# Patient Record
Sex: Female | Born: 1955 | Race: White | Hispanic: No | Marital: Married | Smoking: Former smoker
Health system: Southern US, Community
[De-identification: ages and names within clinical notes are randomized; demographics above are authoritative.]

## PROBLEM LIST (undated history)

## (undated) DIAGNOSIS — R011 Cardiac murmur, unspecified: Secondary | ICD-10-CM

## (undated) DIAGNOSIS — A770 Spotted fever due to Rickettsia rickettsii: Secondary | ICD-10-CM

## (undated) DIAGNOSIS — C55 Malignant neoplasm of uterus, part unspecified: Secondary | ICD-10-CM

## (undated) HISTORY — DX: Malignant neoplasm of uterus, part unspecified: C55

## (undated) HISTORY — PX: TONSILLECTOMY: SUR1361

## (undated) HISTORY — DX: Spotted fever due to Rickettsia rickettsii: A77.0

## (undated) HISTORY — DX: Cardiac murmur, unspecified: R01.1

---

## 1998-12-22 ENCOUNTER — Other Ambulatory Visit: Admission: RE | Admit: 1998-12-22 | Discharge: 1998-12-22 | Payer: Self-pay | Admitting: Obstetrics and Gynecology

## 1999-10-02 ENCOUNTER — Encounter: Payer: Self-pay | Admitting: Obstetrics and Gynecology

## 1999-10-02 ENCOUNTER — Encounter: Admission: RE | Admit: 1999-10-02 | Discharge: 1999-10-02 | Payer: Self-pay | Admitting: Obstetrics and Gynecology

## 1999-12-25 ENCOUNTER — Other Ambulatory Visit: Admission: RE | Admit: 1999-12-25 | Discharge: 1999-12-25 | Payer: Self-pay | Admitting: Obstetrics and Gynecology

## 2000-10-04 ENCOUNTER — Encounter: Admission: RE | Admit: 2000-10-04 | Discharge: 2000-10-04 | Payer: Self-pay | Admitting: Obstetrics and Gynecology

## 2000-10-04 ENCOUNTER — Encounter: Payer: Self-pay | Admitting: Obstetrics and Gynecology

## 2001-02-18 ENCOUNTER — Other Ambulatory Visit: Admission: RE | Admit: 2001-02-18 | Discharge: 2001-02-18 | Payer: Self-pay | Admitting: Obstetrics and Gynecology

## 2002-01-15 ENCOUNTER — Encounter: Admission: RE | Admit: 2002-01-15 | Discharge: 2002-01-15 | Payer: Self-pay | Admitting: Obstetrics and Gynecology

## 2002-01-15 ENCOUNTER — Encounter: Payer: Self-pay | Admitting: Obstetrics and Gynecology

## 2002-10-19 ENCOUNTER — Other Ambulatory Visit: Admission: RE | Admit: 2002-10-19 | Discharge: 2002-10-19 | Payer: Self-pay | Admitting: Obstetrics and Gynecology

## 2003-10-26 ENCOUNTER — Encounter: Admission: RE | Admit: 2003-10-26 | Discharge: 2003-10-26 | Payer: Self-pay | Admitting: Obstetrics and Gynecology

## 2003-11-03 ENCOUNTER — Other Ambulatory Visit: Admission: RE | Admit: 2003-11-03 | Discharge: 2003-11-03 | Payer: Self-pay | Admitting: Obstetrics and Gynecology

## 2005-08-29 ENCOUNTER — Other Ambulatory Visit: Admission: RE | Admit: 2005-08-29 | Discharge: 2005-08-29 | Payer: Self-pay | Admitting: Obstetrics and Gynecology

## 2005-09-07 ENCOUNTER — Encounter: Admission: RE | Admit: 2005-09-07 | Discharge: 2005-09-07 | Payer: Self-pay | Admitting: Obstetrics and Gynecology

## 2005-09-19 ENCOUNTER — Encounter: Admission: RE | Admit: 2005-09-19 | Discharge: 2005-09-19 | Payer: Self-pay | Admitting: Obstetrics and Gynecology

## 2005-12-03 HISTORY — PX: VAGINAL HYSTERECTOMY: SUR661

## 2005-12-05 ENCOUNTER — Inpatient Hospital Stay (HOSPITAL_COMMUNITY): Admission: AD | Admit: 2005-12-05 | Discharge: 2005-12-05 | Payer: Self-pay | Admitting: Obstetrics and Gynecology

## 2006-02-18 ENCOUNTER — Encounter (INDEPENDENT_AMBULATORY_CARE_PROVIDER_SITE_OTHER): Payer: Self-pay | Admitting: *Deleted

## 2006-02-19 ENCOUNTER — Inpatient Hospital Stay (HOSPITAL_COMMUNITY): Admission: RE | Admit: 2006-02-19 | Discharge: 2006-02-20 | Payer: Self-pay | Admitting: Obstetrics and Gynecology

## 2006-09-25 ENCOUNTER — Encounter: Admission: RE | Admit: 2006-09-25 | Discharge: 2006-09-25 | Payer: Self-pay | Admitting: Obstetrics and Gynecology

## 2007-10-23 ENCOUNTER — Encounter: Admission: RE | Admit: 2007-10-23 | Discharge: 2007-10-23 | Payer: Self-pay | Admitting: Obstetrics and Gynecology

## 2007-10-28 ENCOUNTER — Encounter: Admission: RE | Admit: 2007-10-28 | Discharge: 2007-10-28 | Payer: Self-pay | Admitting: Obstetrics and Gynecology

## 2007-12-10 ENCOUNTER — Other Ambulatory Visit: Admission: RE | Admit: 2007-12-10 | Discharge: 2007-12-10 | Payer: Self-pay | Admitting: Obstetrics and Gynecology

## 2008-02-22 ENCOUNTER — Emergency Department (HOSPITAL_COMMUNITY): Admission: EM | Admit: 2008-02-22 | Discharge: 2008-02-22 | Payer: Self-pay | Admitting: Family Medicine

## 2008-10-25 ENCOUNTER — Encounter: Admission: RE | Admit: 2008-10-25 | Discharge: 2008-10-25 | Payer: Self-pay | Admitting: Obstetrics and Gynecology

## 2009-01-04 ENCOUNTER — Emergency Department (HOSPITAL_COMMUNITY): Admission: EM | Admit: 2009-01-04 | Discharge: 2009-01-04 | Payer: Self-pay | Admitting: Emergency Medicine

## 2009-02-04 ENCOUNTER — Ambulatory Visit: Payer: Self-pay | Admitting: Obstetrics and Gynecology

## 2009-02-04 ENCOUNTER — Other Ambulatory Visit: Admission: RE | Admit: 2009-02-04 | Discharge: 2009-02-04 | Payer: Self-pay | Admitting: Obstetrics and Gynecology

## 2009-02-04 ENCOUNTER — Encounter: Payer: Self-pay | Admitting: Obstetrics and Gynecology

## 2009-04-01 ENCOUNTER — Emergency Department (HOSPITAL_COMMUNITY): Admission: EM | Admit: 2009-04-01 | Discharge: 2009-04-01 | Payer: Self-pay | Admitting: Family Medicine

## 2009-09-23 ENCOUNTER — Ambulatory Visit: Payer: Self-pay | Admitting: Family Medicine

## 2009-10-20 ENCOUNTER — Ambulatory Visit: Payer: Self-pay | Admitting: Family Medicine

## 2009-10-22 DIAGNOSIS — K921 Melena: Secondary | ICD-10-CM | POA: Insufficient documentation

## 2009-10-24 ENCOUNTER — Encounter (INDEPENDENT_AMBULATORY_CARE_PROVIDER_SITE_OTHER): Payer: Self-pay | Admitting: *Deleted

## 2009-11-22 ENCOUNTER — Ambulatory Visit: Payer: Self-pay | Admitting: Gastroenterology

## 2009-11-22 ENCOUNTER — Encounter (INDEPENDENT_AMBULATORY_CARE_PROVIDER_SITE_OTHER): Payer: Self-pay | Admitting: *Deleted

## 2009-12-01 ENCOUNTER — Ambulatory Visit: Payer: Self-pay | Admitting: Gastroenterology

## 2009-12-06 ENCOUNTER — Encounter: Payer: Self-pay | Admitting: Gastroenterology

## 2009-12-16 ENCOUNTER — Telehealth: Payer: Self-pay | Admitting: Family Medicine

## 2009-12-20 ENCOUNTER — Ambulatory Visit: Payer: Self-pay | Admitting: Family Medicine

## 2010-04-08 ENCOUNTER — Emergency Department (HOSPITAL_COMMUNITY): Admission: EM | Admit: 2010-04-08 | Discharge: 2010-04-08 | Payer: Self-pay | Admitting: Emergency Medicine

## 2010-05-29 ENCOUNTER — Telehealth: Payer: Self-pay | Admitting: Family Medicine

## 2010-06-09 ENCOUNTER — Ambulatory Visit: Payer: Self-pay | Admitting: Family Medicine

## 2010-06-09 DIAGNOSIS — K120 Recurrent oral aphthae: Secondary | ICD-10-CM | POA: Insufficient documentation

## 2010-06-12 ENCOUNTER — Telehealth: Payer: Self-pay | Admitting: Family Medicine

## 2010-08-03 ENCOUNTER — Encounter: Admission: RE | Admit: 2010-08-03 | Discharge: 2010-08-03 | Payer: Self-pay | Admitting: Internal Medicine

## 2010-08-23 ENCOUNTER — Encounter: Admission: RE | Admit: 2010-08-23 | Discharge: 2010-08-23 | Payer: Self-pay | Admitting: Obstetrics and Gynecology

## 2010-08-23 ENCOUNTER — Ambulatory Visit: Payer: Self-pay | Admitting: Obstetrics and Gynecology

## 2010-08-23 ENCOUNTER — Other Ambulatory Visit: Admission: RE | Admit: 2010-08-23 | Discharge: 2010-08-23 | Payer: Self-pay | Admitting: Obstetrics and Gynecology

## 2010-08-30 ENCOUNTER — Encounter: Admission: RE | Admit: 2010-08-30 | Discharge: 2010-08-30 | Payer: Self-pay | Admitting: Obstetrics and Gynecology

## 2010-12-23 ENCOUNTER — Encounter: Payer: Self-pay | Admitting: General Surgery

## 2010-12-24 ENCOUNTER — Encounter: Payer: Self-pay | Admitting: Obstetrics and Gynecology

## 2011-01-02 NOTE — Assessment & Plan Note (Signed)
Summary: FLU SHOT,TETENUS/TOWER/CLE  Nurse Visit   Allergies: 1)  ! Codeine  Immunizations Administered:  Influenza Vaccine # 1:    Vaccine Type: Fluvax 3+    Site: right deltoid    Mfr: GlaxoSmithKline    Dose: 0.5 ml    Route: IM    Given by: Lowella Petties CMA    Exp. Date: 06/01/2010    Lot #: [id number]    VIS given: 06/26/07 version given December 20, 2009.  Tetanus Vaccine:    Vaccine Type: Td    Site: left deltoid    Mfr: Sanofi Pasteur    Dose: 0.5 ml    Route: IM    Given by: Lowella Petties CMA    Exp. Date: 12/08/2011    Lot #: [id number]    VIS given: 10/21/07 version given December 20, 2009.  Flu Vaccine Consent Questions:    Do you have a history of severe allergic reactions to this vaccine? no    Any prior history of allergic reactions to egg and/or gelatin? no    Do you have a sensitivity to the preservative Thimersol? no    Do you have a past history of Guillan-Barre Syndrome? no    Do you currently have an acute febrile illness? no    Have you ever had a severe reaction to latex? no    Vaccine information given and explained to patient? yes    Are you currently pregnant? no  Orders Added: 1)  Flu Vaccine 44yrs + [90658] 2)  Admin 1st Vaccine [90471] 3)  TD Toxoids IM 7 YR + [90714] 4)  Admin of Any Addtl Vaccine [62952]

## 2011-01-02 NOTE — Letter (Signed)
Summary: Patient Notice-Hyperplastic Polyps  [facility]  [address] [place], [place] [address]   Phone: [phone]  Fax: [phone]        December 06, 2009 MRN: [id number]    [facility] [address] [place], [place]  [address]    Dear Ms. 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  These types of polyps are NOT pre-cancerous.  It is therefore my recommendation that you have a repeat colonoscopy examination in 5_ years for routine colorectal cancer screening.  Should you develop new or worsening symptoms of abdominal pain, bowel habit changes or bleeding from the rectum or bowels, please schedule an evaluation with either your primary care physician or with me.  Additional information/recommendations:  __No further action with gastroenterology is needed at this time.      Please follow-up with your primary care physician for your other      healthcare needs. __Please call [phone] to schedule a return visit to review      your situation.  __Please keep your follow-up visit as already scheduled.  _x_Continue treatment plan as outlined the day of your exam.  Please call us if you are having persistent problems or have questions about your condition that have not been fully answered at this time.  Sincerely,  Louis Meckel MD This letter has been electronically signed by your physician.  Appended Document: Patient Notice-Hyperplastic Polyps Letter mailed 1.14.11

## 2011-01-02 NOTE — Progress Notes (Signed)
Summary: Head congested, sorethroat  Phone Note Call from Patient Call back at [phone] or [phone] work cell   Caller: Patient Call For: Judith Part MD Summary of Call: On 12/13/09 started with head congestion, now has sorethroat and dry cough, ears bother her slightly. Pt doesn't think fever has been taking Dayquil. Pt wonders is there anything else Dr. Milinda Antis could suggest. Pt uses [facility] [phone]. Please advise.  Initial call taken by: Lewanda Rife LPN,  December 16, 2009 1:50 PM  Follow-up for Phone Call        I like mucinex Dm for congestion and cough  f/u if not improved  drink lots of fluids  Follow-up by: Judith Part MD,  December 16, 2009 3:16 PM  Additional Follow-up for Phone Call Additional follow up Details #1::        Advised pt. Additional Follow-up by: Lowella Petties CMA,  December 16, 2009 3:41 PM

## 2011-01-02 NOTE — Progress Notes (Signed)
Summary: sore throat x 1 week   Phone Note Call from Patient Call back at Work Phone [phone]   Caller: Patient Call For: Judith Part MD Summary of Call: Patient calling complaining of sore throat x 1 week. She says that she has no other symptoms. It only hurts when swollowing. She has been taking Ibuprofen for this and has alos tried taking OTC allergy meds (equate brand from [facility]). She wants to know if you have any other suggestions on what she should do for her throat.  Initial call taken by: Melody Comas,  May 29, 2010 11:47 AM  Follow-up for Phone Call        tylenol and chloraseptic throat spray otc are helpful if not improving ( esp if fever or other symptoms)- please f/u Follow-up by: Judith Part MD,  May 29, 2010 1:11 PM  Additional Follow-up for Phone Call Additional follow up Details #1::        Patient notified, will call back if no improvement.  Additional Follow-up by: Melody Comas,  May 29, 2010 1:14 PM

## 2011-01-02 NOTE — Assessment & Plan Note (Signed)
Summary: SORES IN HER MOUTH   Vital Signs:  Patient profile:   55 year old female Height:      65.5 inches Temp:     98.1 degrees F oral Pulse rate:   68 / minute Pulse rhythm:   regular BP sitting:   110 / 70  (right arm) Cuff size:   regular  Vitals Entered By: Linde Gillis CMA Duncan Dull) (June 09, 2010 12:27 PM) CC: sore in mouth and on tongue Comments patient refused to weigh   History of Present Illness: 55 yo here for 2-3 weeks of sores in her mouth.  Mouth sores- first noticed one in inside of her lip, now feels several throughout her mouth.  Has also been painful to swallow, on and off. Never had anything like this before. No fevers, chills, runny nose, other URI symptoms.  Current Medications (verified): 1)  Advil 200 Mg Tabs (Ibuprofen) .... Otc As Directed. 2)  Valtrex 1 Gm Tabs (Valacyclovir Hcl) .... 2 Tab By Mouth Two Times A Day X 1 Days  Allergies: 1)  ! 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    cardiol - Dr Tresa Endo  gyn Dianne Dun   Past Surgical History: Last updated: 09/23/2009 hysterect partial -- fibroids   Family History: Last updated: 09/23/2009 mother CAD , with bypass and stents , started in 21s  father - does not have contact with  Maunts and uncles - CAD  Mcousin DM Muncle DM  great aunt DM   MGF with heart dz in his 43s  mGM pacemaker , CAD with stents , deg joint dz   Social History: Last updated: 09/23/2009 regularly exercises  former smoker - quit over 5 years ago  occ alcohol  is married  2 sons- age 87 , and 75 -- still at home  works for [employer] - loves her job   Review of Systems      See HPI General:  Denies chills and fever. ENT:  Denies difficulty swallowing. GI:  Denies abdominal pain, nausea, and vomiting.  Physical Exam  General:  Well-developed,well-nourished,in no acute distress; alert,appropriate and cooperative throughout  examination Mouth:  aphthous ulcer(s).   ?possible herpetic labilal lesion Cervical Nodes:  No lymphadenopathy noted Psych:  Cognition and judgment appear intact. Alert and cooperative with normal attention span and concentration. No apparent delusions, illusions, hallucinations   Impression & Recommendations:  Problem # 1:  APHTHOUS ULCERS (ICD-528.2) Assessment New Reassurance provided. Given that one lesion appeared herpetic, will treat with Valtrex 2 g two times a day x 1 day. Follow up as needed.  Complete Medication List: 1)  Advil 200 Mg Tabs (Ibuprofen) .... Otc as directed. 2)  Valtrex 1 Gm Tabs (Valacyclovir hcl) .... 2 tab by mouth two times a day x 1 days Prescriptions: VALTREX 1 GM TABS (VALACYCLOVIR HCL) 2 tab by mouth two times a day x 1 days  #4 x 0   Entered and Authorized by:   Ruthe Mannan MD   Signed by:   Ruthe Mannan MD on 06/09/2010   Method used:   [facility]ronically [facility]        [facility] (retail)       [address]       [place], [place]  [address]       Ph: [phone] or [phone]       Fax: [phone]   RxID:   [id number]  Current Allergies (reviewed today): ! CODEINE

## 2011-01-02 NOTE — Progress Notes (Signed)
Summary: not any better  Phone Note Call from Patient Call back at Work Phone [phone]   Caller: Patient Summary of Call: Pt was seen on friday for sores in her mouth.  She says she is not any better, still having to take ibuprofen for the pain.  She also has poison ivy and is asking if something can be called in for that.  She doesnt have any on her face and would like to avoid that.  [facility] and is asking if she can have a mouth wash for the mouth sores and sore throat.  She is going out of town on business tomorrow for 3 days.  Please let her know. Initial call taken by: Lowella Petties CMA,  June 12, 2010 4:48 PM  Follow-up for Phone Call        can try some dukes magic mouthwash elicon cream for poison ivy -- but f/u if not imp watch for signs of infection like inc redness/fever/pus Follow-up by: Judith Part MD,  June 12, 2010 5:06 PM  Additional Follow-up for Phone Call Additional follow up Details #1::        Patient notified as instructed by telephone. [facility] as instructed. Lewanda Rife LPN  June 12, 2010 5:13 PM     New/Updated Medications: * DUKE'S MAGIC MOUTHWASH swish 1 tsp by mouth three times a day and spit ELOCON 0.1 % CREA (MOMETASONE FUROATE) apply to affected areas once daily as needed Prescriptions: ELOCON 0.1 % CREA (MOMETASONE FUROATE) apply to affected areas once daily as needed  #1 medium x 0   Entered and Authorized by:   Judith Part MD   Signed by:   Lewanda Rife LPN on 09/81/1914   Method used:   Telephoned to ...       [facility] (retail)       [address]       [place], [place]  [address]       Ph: [phone] or [phone]       Fax: [phone]   RxID:   [id number] DUKE'S MAGIC MOUTHWASH swish 1 tsp by mouth three times a day and spit  #120cc x 0   Entered and Authorized by:   Judith Part MD   Signed by:   Lewanda Rife LPN on 63/87/5643   Method used:   Telephoned  to ...       [facility] (retail)       [address]       [place], [place]  [address]       Ph: [phone] or [phone]       Fax: [phone]   RxID:   [id number]

## 2011-01-08 ENCOUNTER — Telehealth: Payer: Self-pay | Admitting: Family Medicine

## 2011-01-18 NOTE — Progress Notes (Signed)
Summary: cal a nurse   Phone Note Call from Patient   Call For: Judith Part MD Summary of Call: Triage Record Num: [id number] Operator: Valene Bors Patient Name: Brooke Morrow Threats Call Date & Time: 01/06/2011 11:50:17AM Patient Phone: [phone] PCP: Audrie Gallus. Tower Patient Gender: Female PCP Fax : Patient DOB: 1955/12/06 Practice Name: [facility] Reason for Call: Nyimah is calling b/c she started with ear pain last night -01/05/11 and she has a slight sore throat. Afebrile. She is taking Alleve and it only helped a little. Care advice per Ear Sx Protocol and advised that she be seen at [facility] within 24 hours. Protocol(s) Used: Ear: Symptoms Recommended Outcome per Protocol: See Provider within 24 hours Reason for Outcome: Constant or intermittent dull earache, throbbing in ear(s) or feeling of fullness; may interfere with sleep or ability to carry out normal activities Care Advice: A warm washcloth or heating pad set on low to the affected ear may help relieve the discomfort. May apply for 15 to 20 minutes, 3 to 4 times a day.  ~  ~ Do not use eardrops unless directed by a healthcare provider, especially if having ear drainage. Resting or sleeping with head elevated, such as semi-reclining in a recliner, may help reduce inner ear pressure and discomfort.  ~ Keep ear canal as dry as possible. Take a bath instead of showering. Try to keep water out of ear when washing hair by placing cotton balls in ear opening. Avoid swimming or water sports until [place] by provider.  ~  ~ SYMPTOM / CONDITION MANAGEMENT  ~ CAUTIONS Analgesic/Antipyretic Advice - Acetaminophen: Consider acetaminophen as directed on label or by pharmacist/provider for pain or fever PRECAUTIONS: - Use if there is no history of liver disease, alcoholism, or intake of three or more alcohol drinks per day - Only if approved by provider during pregnancy or when breastfeeding - During pregnancy,  acetaminophen should not be taken more than 3 consecutive days without telling provider - Do not exceed recommended dose or frequency  ~ Analgesic/Antipyretic Advice - NSAID Initial call taken by: Melody Comas,  January 08, 2011 8:12 AM

## 2011-02-01 ENCOUNTER — Ambulatory Visit: Payer: Self-pay | Admitting: Obstetrics and Gynecology

## 2011-02-28 ENCOUNTER — Encounter: Payer: Self-pay | Admitting: Family Medicine

## 2011-02-28 LAB — HM MAMMOGRAPHY

## 2011-02-28 LAB — HM COLONOSCOPY

## 2011-03-01 ENCOUNTER — Other Ambulatory Visit: Payer: Self-pay | Admitting: Gastroenterology

## 2011-03-02 ENCOUNTER — Ambulatory Visit (INDEPENDENT_AMBULATORY_CARE_PROVIDER_SITE_OTHER): Payer: BC Managed Care – PPO | Admitting: Family Medicine

## 2011-03-02 ENCOUNTER — Encounter: Payer: Self-pay | Admitting: Family Medicine

## 2011-03-02 DIAGNOSIS — W57XXXA Bitten or stung by nonvenomous insect and other nonvenomous arthropods, initial encounter: Secondary | ICD-10-CM | POA: Insufficient documentation

## 2011-03-02 DIAGNOSIS — T148 Other injury of unspecified body region: Secondary | ICD-10-CM

## 2011-03-02 MED ORDER — DOXYCYCLINE HYCLATE 100 MG PO CAPS: 100.0000 mg | ORAL_CAPSULE | Freq: Two times a day (BID) | ORAL | Status: AC

## 2011-03-02 NOTE — Patient Instructions (Signed)
Wood Tick Bites Ticks are insects that attach themselves to the skin. Sometimes, ticks carry diseases that can make a person very ill. The most common places for ticks to attach themselves are the scalp, neck, armpits, waist, and groin. Ticks must be removed as soon as possible to help prevent diseases.  REMOVING A TICK  Put on latex gloves before trying to remove a tick.   Use tweezers to grasp the tick as close to the skin as possible.   Pull gently until the tick lets go. Pull the tick off in one motion. Do not twist the tick or jerk it suddenly. This may break off the tick's head or mouth parts.   Do not crush the tick's body. This could transfer diseased fluids from the tick into your body.   After the tick is removed, wash the bite area and your hands with soap and water.   Apply a small amount of antiseptic cream or ointment to the bite site.   Wash any tools that were used.   Save the tick in a jar or plastic bag to bring to the doctor if necessary.   Do not apply a hot match, petroleum jelly, or fingernail polish to the tick. This may increase the chances of disease from the tick bite.  YOU MIGHT NEED A TETANUS SHOT IF:  You cannot remember when your last tetanus shot was.   You have never had a tetanus shot.   Your bite site was dirty.  GET HELP IF:  You cannot remove a tick or part of the tick is left in the skin.   You have a temperature by mouth above 100.4.   You or your child has redness and puffiness (swelling) in the area of the tick bite.   You or your child has tender, puffy lymph glands.   You or your child has watery poop (diarrhea).   You or your child loses weight.   You or your child has a cough.   You or your child is more tired than normal.   You or your child has muscle, joint, or bone pain.   You or your child has belly (abdominal) pain.   You or your child has a headache.   You or your child has a rash.  GET HELP RIGHT AWAY IF:  You  or your child has a temperature by mouth above 100.4, not controlled by medicine.   You or your child has trouble walking or moving his or her legs.   You or your child loses feeling (numbness) in his or her legs.   You or your child has shortness of breath.   You or your child becomes confused.   You or your child throw ups (vomit) many times.  MAKE SURE YOU:   Understand these instructions.   Will watch your condition.

## 2011-03-02 NOTE — Progress Notes (Signed)
  Subjective:    Patient ID: Brooke Morrow, female    DOB: [birth year], 55 y.o.   MRN: [id number]  HPI 55 yo here for tick bite. Found the tick on her skin, Tuesday, 3/27 on left chest wall under bra. Tick was engorged, she is not sure how long it was there. Not sure what type of tick it was.  She denies any fevers, chills, nausea, or myalgias.    Review of Systems See HPI:  Gen:  Denies fevers, chills, myalgias HEENT: no photophobia Skin:  No rash  Objective:   Physical Exam BP 110/80  Pulse 80  Temp(Src) 98.1 F (36.7 C) (Oral)  Ht 5\' 5"  (1.651 m)  Wt 150 lb 12.8 oz (68.402 kg)  BMI 25.09 kg/m2 Gen: alert, NAD Skin: 2 cm circular, raised erythematous area on left side, no central clearing. No other lesions or rashes.       Assessment & Plan:

## 2011-03-02 NOTE — Assessment & Plan Note (Signed)
New.   Area appears very reassuring. Since we do not know how long the tick was attached or what type it was, will treat with Doxycycline 100 mg twice daily x 10 days. See pt instructions.

## 2011-03-02 NOTE — Progress Notes (Deleted)
55 yo here for tick bite.  ?tick attached for ?36 hours  When did this occur? Less than 72 hours after tick removal.  Review of Systems       See HPI  General:  Denies chills, myalgia and fever. ENT:  Denies difficulty swallowing. GI:  Denies abdominal pain, nausea, and vomiting. Skin:  + rash  Physical Exam  General:  Well-developed,well-nourished,in no acute distress; alert,appropriate and cooperative throughout examination Mouth:  aphthous ulcer(s).   ?possible herpetic labilal lesion Cervical Nodes:  No lymphadenopathy noted Psych:  Cognition and judgment appear intact. Alert and cooperative with normal attention span and concentration. No apparent delusions, illusions, hallucinations

## 2011-03-08 ENCOUNTER — Ambulatory Visit
Admission: RE | Admit: 2011-03-08 | Discharge: 2011-03-08 | Disposition: A | Discharge status: Home / Self Care | Payer: BC Managed Care – PPO | Source: Ambulatory Visit | Attending: Gastroenterology | Admitting: Gastroenterology

## 2011-04-18 ENCOUNTER — Other Ambulatory Visit: Payer: Self-pay | Admitting: Gastroenterology

## 2011-04-20 NOTE — Discharge Summary (Signed)
NAMEJACKELYNE, SAYER          ACCOUNT NO.:  [id number]   MEDICAL RECORD NO.:  [id number]          PATIENT TYPE:  INP   LOCATION:  1603                         FACILITY:  [facility]   PHYSICIAN:  Daniel L. Gottsegen, M.D.DATE OF BIRTH:  09-18-1956   DATE OF ADMISSION:  02/18/2006  DATE OF DISCHARGE:  02/20/2006                                 DISCHARGE SUMMARY   The patient is a 56 year old female, who was admitted to the hospital with  symptomatic uterine prolapse, rectocele, menorrhagia, dysmenorrhea, and  fibroids for surgery.  On the day of admission, she was taken to the  operating room, and a vaginal hysterectomy and posterior repair was  performed.  Postoperatively, she had some trouble with nausea, and the IVs  needed to be continued, and she also was somewhat sedated and not ambulating  on her own, so she was kept overnight.  By the 21st, however, she was  voiding well, drinking well, was not nauseated, and was out of bed  ambulating on her on.  She was discharged on Darvocet-N 100 for pain relief.  She will be seen in the office in 3 weeks.  Final pathology report is not  available at the time of dictation.   CONDITION ON DISCHARGE:  Improved.   DISCHARGE DIAGNOSES:  1.  Uterine prolapse.  2.  Fibroids.  3.  Rectocele.  4.  Dysmenorrhea and menorrhagia.   OPERATION:  Vaginal hysterectomy, posterior repair.      Daniel L. Eda Paschal, M.D.  Electronically Signed     DLG/MEDQ  D:  02/20/2006  T:  02/20/2006  Job:  [id number]

## 2011-04-20 NOTE — Op Note (Signed)
NAMEAJOONI, KARAM          ACCOUNT NO.:  [id number]   MEDICAL RECORD NO.:  [id number]          PATIENT TYPE:  OIB   LOCATION:  1603                         FACILITY:  [facility]   PHYSICIAN:  Daniel L. Gottsegen, M.D.DATE OF BIRTH:  04/21/1956   DATE OF PROCEDURE:  02/18/2006  DATE OF DISCHARGE:                                 OPERATIVE REPORT   PRE-AND-POSTOPERATIVE DIAGNOSIS:  1.  Dysmenorrhea.  2.  Menorrhagia.  3.  Fibroids.  4.  Rectocele.  5.  Voiding dysfunction.   OPERATIONS:  Vaginal hysterectomy posterior repair.   SURGEON:  Daniel L. Eda Paschal, M.D.   FIRST ASSISTANT:  Ivor Costa. Farrel Gobble, M.D.   FINDINGS:  The patient's uterus was severely retroverted with second-degree  uterine descensus. The size of the uterus was 10-12 weeks due to large  fibroids. Ovaries, tubes, and pelvic peritoneum were free of any disease.   DESCRIPTION OF PROCEDURE:  After adequate general endotracheal anesthesia,  the patient was placed in the dorsal supine position, prepped and draped in  the usual sterile manner. A 1:200,000 solution of epinephrine was injected  around the cervix and 1/2% Xylocaine. A 360-degree incision was made around  the cervix. The bladder was mobilized, superiorly, as was the posterior  peritoneum. The posterior peritoneum and vesicouterine fold of peritoneum  were entered with sharp dissection.  The uterosacral ligaments were clamped,  in clamping them they were shortened, and then they were sutured to the  vault, laterally, for good vault support. Cardinal ligaments, uterine  arteries and most of the broad ligament were then clamped, cut and suture  ligated.   The uterus was partially delivered. It required morcellation and  myomectomies to make a small enough to reduce it to get out, but this was  done; and then the balance of the broad ligament, utero-ovarian ligament,  round ligaments, and fallopian tubes were successfully clamped, cut, and  suture  ligated. Suture material for all the above-mentioned pedicles was #1  chromic catgut. All major vascular bundles were doubly ligated. The uterus  was sent in pieces to pathology for tissue diagnosis. The vaginal cuff was  then sutured to the posterior peritoneum with a running locking #0 Vicryl. A  culdoplasty was done using 2-0 Vicryl incorporating uterosacral ligaments  and the peritoneum posteriorly between them. Copious irrigation was done  with sterile saline; and then the cuff and the peritoneum were closed with  figure-of-eights of #1 chromic catgut.   Attention was next turned to the posterior repair. The patient had a fairly  large rectocele, but a good perineal body. The posterior vaginal mucosa was  undermined from the introitus to the top of the cuff. The perirectal fascia  and rectocele were dissected free and then the rectocele was reduced with  interrupted 2-0 Vicryl bringing together good perirectal fascia. Redundant  vaginal mucosa was trimmed away; and the posterior vaginal mucosa and the  perirectal  fascia were incorporated in a running locking 2-0 Vicryl suture. The sponge,  needle, and instrument counts were correct. A Foley catheter was inserted  into the bladder which drained clear urine; and the vagina was packed  with 1-  inch Iodoform.      Daniel L. Eda Paschal, M.D.  Electronically Signed     DLG/MEDQ  D:  02/18/2006  T:  02/18/2006  Job:  [id number]

## 2011-04-20 NOTE — H&P (Signed)
Brooke Morrow, Brooke Morrow          ACCOUNT NO.:  [id number]   MEDICAL RECORD NO.:  [id number]          PATIENT TYPE:  AMB   LOCATION:  DAY                          FACILITY:  [facility]   PHYSICIAN:  Daniel L. Gottsegen, M.D.DATE OF BIRTH:  February 28, 1956   DATE OF ADMISSION:  02/18/2006  DATE OF DISCHARGE:                                HISTORY & PHYSICAL   CHIEF COMPLAINT:  Fibroid uterus with uterine prolapse and difficulty  urinating, dysmenorrhea and menorrhagia.   HISTORY OF PRESENT ILLNESS:  The patient is a 55 year old, gravida 3, para  2, AB1 who has been watched in our office with severe dysmenorrhea,  menorrhagia, symptoms of uterine prolapse consistent with findings of an  enlarged fibroid uterus which is retroverted and somewhat prolapsed. These  symptoms have also included dyspareunia due to a retroverted_uterus due to  the position and the size and in addition some difficulty in eliminating  stool. Although she has continued to put up with these conditions which at  least some of them were slightly alleviated with a Mirena IUD, they  continued to bother her but what finally got her to proceed with surgery was  urinary retention, having to switch positions in order to urinate,  difficulty in emptying her bladder. She was sent to Dr. Earlene Plater who felt that  the cause of these symptoms was the size of the uterus as well as its  position and felt that hysterectomy would help that along with obviously the  menorrhagia and dysmenorrhea discussed above. He did not feel after  urodynamics however that she needed a sling procedure. She now enters the  hospital for vaginal hysterectomy due to the above. We will leave in her  ovaries unless there is significant disease as per our preoperative  discussion with her and her desires. Once we have the uterus out, we will  decide whether she has a rectocele and it is difficult to tell now because  of the position and size of the uterus and we will  proceed with a rectocele  repair as well if appropriate.   PAST MEDICAL HISTORY:  Surgery includes a C section in 1986 and a  tonsillectomy.   CURRENT MEDICATIONS:  None.   ALLERGIES:  She is allergic to CODEINE.   FAMILY HISTORY:  Reveals that mother, maternal grandmother, and maternal  grandfather all have coronary artery disease. Maternal grandmother is  diabetic and mother is hypertensive.   SOCIAL HISTORY:  She is a nonsmoker, nondrinker, she does use caffeine, she  does exercise regularly.   REVIEW OF SYSTEMS:  GENERAL:  Negative. SKIN/BREASTS:  Negative. ENT:  Negative. CARDIOVASCULAR:  Negative. RESPIRATORY:  Negative. GI:  Negative.  MUSCULOSKELETAL:  Negative. GU:  See above. NEUROLOGIC/PSYCHIATRIC:  Negative. ALLERGIC/IMMUNOLOGIC/LYMPHATIC/ENDOCRINE:  Negative.   PHYSICAL EXAMINATION:  GENERAL:  The patient is a well-developed, well-  nourished female in no acute distress.  VITAL SIGNS:  Her blood pressure is 118/70, pulse is 80 and regular,  respirations 16, nonlabored. She is afebrile.  HEENT:  All within normal limits.  NECK:  Supple, trachea in the midline, thyroid is not enlarged.  LUNGS:  Clear to  P&A.  HEART:  No thrills, heaves or murmurs.  BREASTS:  No masses.  ABDOMEN:  Soft without guarding, rebound or masses.  PELVIC:  External is normal, BUS is normal, vaginal is normal, cervix is  clean. The uterus is severely retroverted and enlarged to 10 week size by  fibroids. Adnexa are palpably normal. Rectovaginal is negative.   IMPRESSION:  Symptoms of uterine prolapse, urinary difficulties due to  uterus, fibroids, menorrhagia, dysmenorrhea, possible rectocele.   PLAN:  Vaginal hysterectomy with possible posterior repair.      Daniel L. Eda Paschal, M.D.  Electronically Signed     DLG/MEDQ  D:  02/17/2006  T:  02/18/2006  Job:  [id number]

## 2011-05-15 ENCOUNTER — Ambulatory Visit (INDEPENDENT_AMBULATORY_CARE_PROVIDER_SITE_OTHER): Payer: BC Managed Care – PPO | Admitting: Internal Medicine

## 2011-05-15 ENCOUNTER — Encounter: Payer: Self-pay | Admitting: Internal Medicine

## 2011-05-15 VITALS — BP 113/74 | HR 70 | Temp 98.2°F | Ht 66.0 in | Wt 153.0 lb

## 2011-05-15 DIAGNOSIS — K120 Recurrent oral aphthae: Secondary | ICD-10-CM

## 2011-05-15 NOTE — Assessment & Plan Note (Signed)
Aphthous ulcers - no HIV risks, offered to test and check other immunodeficiencies but with low likelihood and patient deferred for now.  Does not appear to be viral, does not respond to antivirals.  No good evidence that nutritional status can cause or alleviate.  Since it resolves with amoxicillin, I offered for the patient to give her a prescription as needed for that (500mg  BID).  She generally takes it for 3-5 days.  No known preventive measures effective.

## 2011-05-15 NOTE — Progress Notes (Signed)
  Subjective:    Patient ID: Brooke Morrow, female    DOB: 10/03/1956, 55 y.o.   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  Reports that she gets up to 20 different ulcers at a time and can become painful.  No fever, but get uncontrolled diarrhea occasionally with these.  Seems to respond to amoxicillin and not to antivirals.  No significant weight loss.  No night sweats.  Has had thorough w/u including colonoscopy and multiple lab exams and culture of ulcers, all unrevealing.      Review of Systems  Constitutional: Negative.   HENT:       Ulcers  Eyes: Negative.   Respiratory: Negative.   Cardiovascular: Negative.   Gastrointestinal: Positive for abdominal pain, diarrhea and blood in stool.  Genitourinary: Negative.   Musculoskeletal: Negative.   Skin: Negative.   Neurological: Negative.   Hematological: Negative.   Psychiatric/Behavioral: Negative.        Objective:   Physical Exam  Constitutional: She appears well-developed and well-nourished. No distress.  HENT:       + 2 ulcers noted, opposite sides, cheek and lip  Eyes: No scleral icterus.  Cardiovascular: Normal rate and regular rhythm.   No murmur heard. Pulmonary/Chest: Effort normal and breath sounds normal. No respiratory distress.  Abdominal: Soft. Bowel sounds are normal. She exhibits no distension.  Lymphadenopathy:    She has no cervical adenopathy.  Skin: Skin is warm and dry. No erythema.  Psychiatric: She has a normal mood and affect.          Assessment & Plan:

## 2011-08-30 ENCOUNTER — Other Ambulatory Visit: Payer: Self-pay | Admitting: Internal Medicine

## 2011-08-30 ENCOUNTER — Telehealth: Payer: Self-pay | Admitting: *Deleted

## 2011-08-30 DIAGNOSIS — K120 Recurrent oral aphthae: Secondary | ICD-10-CM

## 2011-08-30 MED ORDER — AMOXICILLIN 500 MG PO TABS: 500.0000 mg | ORAL_TABLET | Freq: Two times a day (BID) | ORAL | Status: DC

## 2011-08-30 NOTE — Telephone Encounter (Signed)
Ok for amoxicillin 500 mg bid for 7 days.  2 refills.

## 2011-08-30 NOTE — Telephone Encounter (Signed)
Dr. Luciana Axe sent RX to patients pharmacy [facility] in [place].  Patient notified. Wendall Mola CMA

## 2011-08-30 NOTE — Telephone Encounter (Signed)
Patient called for an RX of for an antibiotic. She states that at her last visit she was told that if she were to get the sores in her mouth again that we would call in an antibiotic for her. She says that [facility] uses [facility] [place] phone # [phone]. Advised her would discuss with her provider and get back to her.

## 2012-01-30 ENCOUNTER — Other Ambulatory Visit: Payer: Self-pay | Admitting: Internal Medicine

## 2012-01-30 DIAGNOSIS — Z8719 Personal history of other diseases of the digestive system: Secondary | ICD-10-CM

## 2012-03-04 ENCOUNTER — Ambulatory Visit (INDEPENDENT_AMBULATORY_CARE_PROVIDER_SITE_OTHER): Payer: BC Managed Care – PPO | Admitting: Family Medicine

## 2012-03-04 ENCOUNTER — Encounter: Payer: Self-pay | Admitting: Family Medicine

## 2012-03-04 VITALS — BP 112/72 | HR 74 | Temp 97.8°F | Ht 65.0 in | Wt 152.5 lb

## 2012-03-04 DIAGNOSIS — F438 Other reactions to severe stress: Secondary | ICD-10-CM

## 2012-03-04 DIAGNOSIS — F430 Acute stress reaction: Secondary | ICD-10-CM | POA: Insufficient documentation

## 2012-03-04 DIAGNOSIS — D179 Benign lipomatous neoplasm, unspecified: Secondary | ICD-10-CM | POA: Insufficient documentation

## 2012-03-04 DIAGNOSIS — F4389 Other reactions to severe stress: Secondary | ICD-10-CM

## 2012-03-04 NOTE — Progress Notes (Signed)
Subjective:    Patient ID: Brooke Morrow, female    DOB: 29-May-1956, 56 y.o.   MRN: [id number]  HPI Here for lump on her shoulder and problems with delayed cognitive response   Lump on her shoulder -- noticed it - feels like a knot on her shoulder blade  There for about a month Has not really changed  Was wondering about insect bite but did not go away  Is in area where she often gets a rash that is itchy  Not painful or red   Problems with delayed cognitive response  Not a memory issue  2 instances  One while she was driving -- went out of it / intrusive thoughts and lost her concentration (aware/ conscious) In very stressful time -- ? 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 APHTHOUS ULCERS  . HEMOCCULT POSITIVE STOOL  . Tick bite  . Stress reaction  . Lipoma   Past Medical History  Diagnosis Date  . Heart murmur     faint  . RMSF [facility] spotted fever)    Past Surgical History  Procedure Date  . Partial hysterectomy     fibroids   History  Substance Use Topics  . Smoking status: Former Smoker    Quit date: 02/27/2006  . Smokeless tobacco: Never Used  . Alcohol Use: Yes     occasional wine   Family History  Problem Relation Age of Onset  . Heart disease Mother 50    bypass and stents   . Heart disease Maternal Aunt   . Diabetes Maternal Aunt   . Heart disease Maternal Uncle   . Diabetes  Maternal Uncle   . Heart disease Maternal Grandmother     pacemaker, stents  . Heart disease Maternal Grandfather 40  . Diabetes Cousin    Allergies  Allergen Reactions  . Codeine     REACTION: could not stay alert   Current Outpatient Prescriptions on File Prior to Visit  Medication Sig Dispense Refill  . amoxicillin (AMOXIL) 500 MG capsule TAKE 1 CAPSULE BY MOUTH 2 TIMES A DAY  14 capsule  2  . ibuprofen (ADVIL,MOTRIN) 200 MG tablet OTC as directed            Review of Systems Review of Systems  Constitutional: Negative for fever, appetite change,  and unexpected weight change. pos for fatigue  Eyes: Negative for pain and visual disturbance.  Respiratory: Negative for cough and shortness of breath.   Cardiovascular: Negative for cp or palpitations    Gastrointestinal: Negative for nausea, diarrhea and constipation.  Genitourinary: Negative for urgency and frequency.  Skin: Negative for pallor or rash   Neurological: Negative for weakness, light-headedness, numbness and pos for occas headaches  MSK neg for joint pain or swelling  Hematological: Negative for adenopathy. Does not bruise/bleed easily.  Psychiatric/Behavioral: Negative for dysphoric mood. The patient is not  nervous/anxious.  pos for severe stress        Objective:   Physical Exam  Constitutional: She is oriented to person, place, and time. She appears well-developed and well-nourished. No distress.  HENT:  Head: Normocephalic and atraumatic.  Mouth/Throat: Oropharynx is clear and moist.  Eyes: Conjunctivae and EOM are normal. Pupils are equal, round, and reactive to light. No scleral icterus.  Neck: Normal range of motion. Neck supple. No JVD present. Carotid bruit is not present. No thyromegaly present.  Cardiovascular: Normal rate, regular rhythm, normal heart sounds and intact distal pulses.  Exam reveals no gallop.   No murmur heard. Pulmonary/Chest: Effort normal and breath sounds normal. No  respiratory distress. She has no wheezes.  Abdominal: She exhibits no abdominal bruit.  Musculoskeletal: She exhibits no edema and no tenderness.  Lymphadenopathy:    She has no cervical adenopathy.  Neurological: She is alert and oriented to person, place, and time. She has normal reflexes. She displays no atrophy and no tremor. No cranial nerve deficit or sensory deficit. She exhibits normal muscle tone. Coordination and gait normal.       No focal cerebellar signs  conversative and mentally sharp  Skin: Skin is warm and dry. No rash noted. No erythema. No pallor.       2-3 cm soft rubbery oval mass that is mobile noted on R shoulder blade Non tender No skin changes   Psychiatric: She has a normal mood and affect. Her behavior is normal. Judgment and thought content normal.       Good eye contact and comm skills           Assessment & Plan:

## 2012-03-04 NOTE — Assessment & Plan Note (Signed)
I think this is the cause of her symptoms of inattentiveness Disc situational stressors and coping skills in detail Will go to counseling today-most important Disc imp of self care  Will continue to follow closely  Adv to avoid sedating medication >25 min spent with face to face with patient, >50% counseling and/or coordinating care

## 2012-03-04 NOTE — Patient Instructions (Signed)
I think your concentration problems are linked to overwhelming stress at times  If these episodes continue to happen or any new symptoms please update me  Go forward with grief counseling - this is most important  Please try to remember to take care of yourself  Let us know the name of the allergy pill you take I think the spot on your back is a lipoma- will observe for now but if it increases in size or becomes painful please let me know

## 2012-03-04 NOTE — Assessment & Plan Note (Signed)
2-3 cm soft rubbery lesion over R shoulder blade consistent with lipoma Plan to obs unless growth or symptomatic

## 2012-05-06 ENCOUNTER — Encounter: Payer: Self-pay | Admitting: Family Medicine

## 2012-05-06 ENCOUNTER — Ambulatory Visit (INDEPENDENT_AMBULATORY_CARE_PROVIDER_SITE_OTHER): Payer: BC Managed Care – PPO | Admitting: Family Medicine

## 2012-05-06 VITALS — BP 116/64 | HR 74 | Temp 97.9°F | Wt 148.8 lb

## 2012-05-06 DIAGNOSIS — T148XXA Other injury of unspecified body region, initial encounter: Secondary | ICD-10-CM

## 2012-05-06 DIAGNOSIS — W57XXXA Bitten or stung by nonvenomous insect and other nonvenomous arthropods, initial encounter: Secondary | ICD-10-CM

## 2012-05-06 DIAGNOSIS — T148 Other injury of unspecified body region: Secondary | ICD-10-CM

## 2012-05-06 MED ORDER — DOXYCYCLINE HYCLATE 100 MG PO TABS: 100.0000 mg | ORAL_TABLET | Freq: Two times a day (BID) | ORAL | Status: AC

## 2012-05-06 NOTE — Patient Instructions (Signed)
Keep tick bites clean and dry with antibacterial soap and water  Wear insect repellent - DEET works best  If worse achiness or other symptoms such as fever or rash- take the doxycycline and let me know

## 2012-05-06 NOTE — Progress Notes (Signed)
Subjective:    Patient ID: Brooke Morrow, female    DOB: [birth year], 56 y.o.   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 APHTHOUS ULCERS  . HEMOCCULT POSITIVE STOOL  . Tick bite  . Stress reaction  . Lipoma   Past Medical History  Diagnosis Date  . Heart murmur     faint  . RMSF [facility] spotted fever)    Past Surgical History  Procedure Date  . Partial hysterectomy     fibroids   History  Substance Use Topics  . Smoking status: Former Smoker    Quit date: 02/27/2006  . Smokeless tobacco: Never Used  . Alcohol Use: Yes     occasional wine   Family History  Problem Relation Age of Onset  . Heart disease Mother 50    bypass and stents   . Heart disease Maternal Aunt   . Diabetes Maternal Aunt   . Heart disease Maternal Uncle   . Diabetes Maternal Uncle   . Heart disease Maternal Grandmother     pacemaker, stents  . Heart disease Maternal Grandfather 40  . Diabetes Cousin    Allergies  Allergen Reactions  . Codeine     REACTION: could not stay alert   Current Outpatient Prescriptions on File Prior to Visit  Medication Sig Dispense Refill  . cetirizine (ZYRTEC) 10 MG tablet Take 10 mg by mouth daily as needed.      Marland Kitchen ibuprofen (ADVIL,MOTRIN) 200 MG tablet OTC as directed       . amoxicillin (AMOXIL) 500 MG capsule TAKE 1 CAPSULE BY MOUTH 2 TIMES A DAY  14 capsule  2      Review of Systems Review of Systems  Constitutional: Negative for fever, appetite change, fatigue and unexpected weight change.  Eyes:  Negative for pain and visual disturbance.  Respiratory: Negative for cough and shortness of breath.   Cardiovascular: Negative for cp or palpitations    Gastrointestinal: Negative for nausea, diarrhea and constipation.  Genitourinary: Negative for urgency and frequency.  Skin: Negative for pallor or rash  pos for tick bites  MSK pos for achiness without joint swelling or tenderness  Neurological: Negative for weakness, light-headedness, numbness and headaches.  Hematological: Negative for adenopathy. Does not bruise/bleed easily.  Psychiatric/Behavioral: Negative for dysphoric mood. The patient is not nervous/anxious.         Objective:   Physical Exam  Constitutional: She appears well-developed and well-nourished. No distress.  HENT:  Head: Normocephalic and atraumatic.  Mouth/Throat: Oropharynx is clear and moist.  Eyes: Conjunctivae and EOM are normal. Pupils are equal, round, and reactive to light. Right eye exhibits no discharge. Left eye exhibits no discharge. No scleral icterus.  Neck: Normal range of motion. Neck supple.  Cardiovascular: Normal rate and regular rhythm.   Pulmonary/Chest: Effort normal and breath sounds normal. No respiratory distress. She has no  wheezes.  Abdominal: Soft. Bowel sounds are normal.  Musculoskeletal:       No acute joint changes   Lymphadenopathy:    She has no cervical adenopathy.  Neurological: She is alert. She has normal reflexes.  Skin: Skin is warm and dry. No rash noted. No pallor.       Small .5 cm scabs with slt erythema at sites of tick bites under arms / L shoulder/ L groin No rash noted No target shaped lesions  Psychiatric: She has a normal mood and affect.          Assessment & Plan:

## 2012-05-06 NOTE — Assessment & Plan Note (Signed)
Several tick bites-all benign appearing - no target shaped lesions/ rashes or s/s of infection  No joint changes but pt does feel achey inst her to watch temp -if fever or worse symptoms will take a full 10 d of doxycycline and update me  Update if not starting to improve in a week or if worsening   Disc use of DEET insect repellent and app clothing

## 2012-08-08 ENCOUNTER — Other Ambulatory Visit: Payer: Self-pay | Admitting: Obstetrics and Gynecology

## 2012-08-08 DIAGNOSIS — Z1231 Encounter for screening mammogram for malignant neoplasm of breast: Secondary | ICD-10-CM

## 2012-08-14 ENCOUNTER — Encounter: Payer: Self-pay | Admitting: Gynecology

## 2012-08-14 ENCOUNTER — Ambulatory Visit: Payer: BC Managed Care – PPO

## 2012-08-14 DIAGNOSIS — D219 Benign neoplasm of connective and other soft tissue, unspecified: Secondary | ICD-10-CM | POA: Insufficient documentation

## 2012-08-27 ENCOUNTER — Encounter: Payer: Self-pay | Admitting: Obstetrics and Gynecology

## 2012-08-27 ENCOUNTER — Ambulatory Visit (INDEPENDENT_AMBULATORY_CARE_PROVIDER_SITE_OTHER): Payer: BC Managed Care – PPO | Admitting: Obstetrics and Gynecology

## 2012-08-27 ENCOUNTER — Ambulatory Visit
Admission: RE | Admit: 2012-08-27 | Discharge: 2012-08-27 | Disposition: A | Discharge status: Home / Self Care | Payer: BC Managed Care – PPO | Source: Ambulatory Visit | Attending: Obstetrics and Gynecology | Admitting: Obstetrics and Gynecology

## 2012-08-27 VITALS — BP 126/80 | Ht 65.5 in | Wt 149.0 lb

## 2012-08-27 DIAGNOSIS — R102 Pelvic and perineal pain: Secondary | ICD-10-CM

## 2012-08-27 DIAGNOSIS — Z1231 Encounter for screening mammogram for malignant neoplasm of breast: Secondary | ICD-10-CM

## 2012-08-27 DIAGNOSIS — N949 Unspecified condition associated with female genital organs and menstrual cycle: Secondary | ICD-10-CM

## 2012-08-27 DIAGNOSIS — Z01419 Encounter for gynecological examination (general) (routine) without abnormal findings: Secondary | ICD-10-CM

## 2012-08-27 DIAGNOSIS — Z833 Family history of diabetes mellitus: Secondary | ICD-10-CM

## 2012-08-27 DIAGNOSIS — Z780 Asymptomatic menopausal state: Secondary | ICD-10-CM

## 2012-08-27 LAB — CBC WITH DIFFERENTIAL/PLATELET
Basophils Absolute: 0 10*3/uL (ref 0.0–0.1)
Basophils Relative: 0 % (ref 0–1)
Eosinophils Absolute: 0.1 10*3/uL (ref 0.0–0.7)
Eosinophils Relative: 2 % (ref 0–5)
HCT: 37 % (ref 36.0–46.0)
Hemoglobin: 12.9 g/dL (ref 12.0–15.0)
Lymphocytes Relative: 31 % (ref 12–46)
Lymphs Abs: 1.5 10*3/uL (ref 0.7–4.0)
MCH: 29.9 pg (ref 26.0–34.0)
MCHC: 34.9 g/dL (ref 30.0–36.0)
MCV: 85.8 fL (ref 78.0–100.0)
Monocytes Absolute: 0.3 10*3/uL (ref 0.1–1.0)
Monocytes Relative: 7 % (ref 3–12)
Neutro Abs: 2.8 10*3/uL (ref 1.7–7.7)
Neutrophils Relative %: 60 % (ref 43–77)
Platelets: 273 10*3/uL (ref 150–400)
RBC: 4.31 MIL/uL (ref 3.87–5.11)
RDW: 12.8 % (ref 11.5–15.5)
WBC: 4.7 10*3/uL (ref 4.0–10.5)

## 2012-08-27 LAB — HEMOGLOBIN A1C
Hgb A1c MFr Bld: 5.3 % (ref <5.7)
Mean Plasma Glucose: 105 mg/dL (ref <117)

## 2012-08-27 LAB — LIPID PANEL
Cholesterol: 199 mg/dL (ref 0–200)
HDL: 63 mg/dL (ref >39)
Triglycerides: 85 mg/dL (ref <150)

## 2012-08-27 NOTE — Patient Instructions (Signed)
Schedule ultrasound

## 2012-08-27 NOTE — Progress Notes (Signed)
Patient came to see me today for her annual GYN exam. She is not having any menopausal symptoms. We have not documented menopause and I suspect she is still premenopausal. She is getting periodic left lower quadrant pain and wonders if it is  ovulation pain. She is scheduled for a mammogram. She has never had an abnormal Pap smear. Her last Pap smear was 2011. She had a vaginal hysterectomy, posterior repair in 2007 for fibroids and rectocele. She is having no vaginal bleeding. She is still struggling with her Mother's death earlier this year in a car accident. She does her lab through our office.  HEENT: Within normal limits. Kennon Portela present. Neck: No masses. Supraclavicular lymph nodes: Not enlarged. Breasts: Examined in both sitting and lying position. Symmetrical without skin changes or masses. Abdomen: Soft no masses guarding or rebound. No hernias. Pelvic: External within normal limits. BUS within normal limits. Vaginal examination shows good estrogen effect, no cystocele enterocele or rectocele. Cervix and uterus absent. Adnexa within normal limits. Rectovaginal confirmatory. Extremities within normal limits.  Assessment: Left lower quadrant pain  Plan: Mammogram. Pelvic ultrasound. FSH checked.The new Pap smear guidelines were discussed with the patient. No pap done.

## 2012-08-28 LAB — URINALYSIS W MICROSCOPIC + REFLEX CULTURE
Bacteria, UA: NONE SEEN
Casts: NONE SEEN
Crystals: NONE SEEN
Leukocytes, UA: NEGATIVE
Nitrite: NEGATIVE
Specific Gravity, Urine: 1.014 (ref 1.005–1.030)
Urobilinogen, UA: 0.2 mg/dL (ref 0.0–1.0)
pH: 5.5 (ref 5.0–8.0)

## 2012-08-28 LAB — FOLLICLE STIMULATING HORMONE: FSH: 7.4 m[IU]/mL

## 2012-09-03 ENCOUNTER — Ambulatory Visit (INDEPENDENT_AMBULATORY_CARE_PROVIDER_SITE_OTHER): Payer: BC Managed Care – PPO | Admitting: Obstetrics and Gynecology

## 2012-09-03 ENCOUNTER — Ambulatory Visit (INDEPENDENT_AMBULATORY_CARE_PROVIDER_SITE_OTHER): Payer: BC Managed Care – PPO

## 2012-09-03 DIAGNOSIS — N839 Noninflammatory disorder of ovary, fallopian tube and broad ligament, unspecified: Secondary | ICD-10-CM

## 2012-09-03 DIAGNOSIS — N831 Corpus luteum cyst of ovary, unspecified side: Secondary | ICD-10-CM

## 2012-09-03 DIAGNOSIS — N949 Unspecified condition associated with female genital organs and menstrual cycle: Secondary | ICD-10-CM

## 2012-09-03 DIAGNOSIS — R102 Pelvic and perineal pain: Secondary | ICD-10-CM

## 2012-09-03 DIAGNOSIS — N83209 Unspecified ovarian cyst, unspecified side: Secondary | ICD-10-CM

## 2012-09-03 NOTE — Patient Instructions (Signed)
Schedule ultrasound for early December.

## 2012-09-03 NOTE — Progress Notes (Signed)
Patient came back to see me today because of cyclical left lower quadrant pain. Her FSH came back at 7. On ultrasound today her uterus is surgically absent. On her right ovary there is a thick walled cyst that is both cystic and solid of about 2 cm. There is blood flow to the periphery of it. It certainly could be a corpus luteal cyst. Her left ovary is normal. Her cul-de-sac is free of fluid.  Assessment: #1. Left lower quadrant pain #2. Right ovarian cyst  Plan: I told patient that I doubted that the cyst explain her pain. Since she is perimenopausal it is probably ovulatory pain. We will do a followup ultrasound for the cyst in 8 weeks.

## 2012-09-23 ENCOUNTER — Other Ambulatory Visit: Payer: Self-pay | Admitting: Internal Medicine

## 2012-09-24 ENCOUNTER — Telehealth: Payer: Self-pay | Admitting: *Deleted

## 2012-09-24 NOTE — Telephone Encounter (Signed)
She will either need to be seen once a year or she will need to get it from her primary.

## 2012-09-24 NOTE — Telephone Encounter (Signed)
Received electronic refill request for amoxicillin, patient not seen since 05/2011.  Was given Rx for mouth ulcers, please advise Wendall Mola CMA

## 2012-09-25 ENCOUNTER — Other Ambulatory Visit: Payer: Self-pay | Admitting: Internal Medicine

## 2012-10-29 ENCOUNTER — Ambulatory Visit (INDEPENDENT_AMBULATORY_CARE_PROVIDER_SITE_OTHER): Payer: BC Managed Care – PPO

## 2012-10-29 DIAGNOSIS — Z23 Encounter for immunization: Secondary | ICD-10-CM

## 2012-11-03 ENCOUNTER — Ambulatory Visit (INDEPENDENT_AMBULATORY_CARE_PROVIDER_SITE_OTHER): Payer: BC Managed Care – PPO

## 2012-11-03 ENCOUNTER — Ambulatory Visit (INDEPENDENT_AMBULATORY_CARE_PROVIDER_SITE_OTHER): Payer: BC Managed Care – PPO | Admitting: Obstetrics and Gynecology

## 2012-11-03 DIAGNOSIS — R102 Pelvic and perineal pain unspecified side: Secondary | ICD-10-CM

## 2012-11-03 DIAGNOSIS — N831 Corpus luteum cyst of ovary, unspecified side: Secondary | ICD-10-CM

## 2012-11-03 DIAGNOSIS — N83209 Unspecified ovarian cyst, unspecified side: Secondary | ICD-10-CM

## 2012-11-03 DIAGNOSIS — R3915 Urgency of urination: Secondary | ICD-10-CM

## 2012-11-03 DIAGNOSIS — N949 Unspecified condition associated with female genital organs and menstrual cycle: Secondary | ICD-10-CM

## 2012-11-03 NOTE — Patient Instructions (Addendum)
Schedule ultrasound in 3 months. 

## 2012-11-03 NOTE — Progress Notes (Signed)
Patient came to see me today for a followup ultrasound. The original one was done in October, 2013 for left lower quadrant pain. She says the left lower quadrant pain is almost completely gone. She did however have a complex cyst on her right ovary. As a result of this she returns today for followup. On ultrasound her uterus is surgically absent. On her right ovary there is a thickwalled cystic mass of 1.8 cm which is smaller than it was 2 months ago when it  was 2 cm. The cystic area of the mass is getting smaller as if it  is involuting. There is some vascular flow to the periphery of the cyst. Her left ovary is normal. Her cul-de-sac is free of fluid.  Assessment: Improving left lower quadrant pain. Right ovarian cyst appearing to involute.  Plan: I believe this is cyst  be watched. She'll return in 3 months for ultrasound and appointment  with Dr. Audie Box. She also mentioned to me today that she will occasionally get urgency to urinate. It is not enough of a problem to require treatment.

## 2013-01-15 ENCOUNTER — Ambulatory Visit: Payer: BC Managed Care – PPO | Admitting: Internal Medicine

## 2013-01-22 ENCOUNTER — Encounter: Payer: Self-pay | Admitting: Family Medicine

## 2013-01-22 ENCOUNTER — Ambulatory Visit (INDEPENDENT_AMBULATORY_CARE_PROVIDER_SITE_OTHER): Payer: 59 | Admitting: Family Medicine

## 2013-01-22 DIAGNOSIS — H6690 Otitis media, unspecified, unspecified ear: Secondary | ICD-10-CM

## 2013-01-22 MED ORDER — CEFDINIR 300 MG PO CAPS: 600.0000 mg | ORAL_CAPSULE | Freq: Every day | ORAL | Status: DC

## 2013-01-22 NOTE — Progress Notes (Signed)
Nature conservation officer at [facility] [address] [place] [address] Phone: [phone] Fax: [phone]  Date:  01/22/2013   Name:  Brooke Morrow   DOB:  06-30-1956   MRN:  [id number] Gender: female Age: 57 y.o.  Primary Physician:  Roxy Manns, MD  Evaluating MD: Hannah Beat, MD   Chief Complaint: Sinusitis and Otalgia   History of Present Illness:  Brooke Morrow is a 57 y.o. pleasant patient who presents with the following:  Last Saturday, feeling a lot of congestion and in the back of her head. Left and went to [place]. Called the on call MD with her husband's on-call MD. Afrin. She was also sent in a dose of some amoxicillin, and I think that the dosing was 500 mg p.o. B.i.d. It has actually gotten worse since her initial notice of symptoms.    Patient Active Problem List  Diagnosis  . APHTHOUS ULCERS  . HEMOCCULT POSITIVE STOOL  . Tick bite  . Stress reaction  . Lipoma  . Fibroid  . Rectocele    Past Medical History  Diagnosis Date  . Heart murmur     faint  . [facility] spotted fever)   . Fibroid   . Rectocele     Past Surgical History  Procedure Laterality Date  . Vaginal hysterectomy    . Cesarean section    . Tonsillectomy      History  Substance Use Topics  . Smoking status: Former Smoker    Quit date: 02/27/2006  . Smokeless tobacco: Never Used  . Alcohol Use: Yes     Comment: occasional wine    Family History  Problem Relation Age of Onset  . Heart disease Mother 50    bypass and stents   . Hypertension Mother   . Heart disease Maternal Aunt   . Diabetes Maternal Aunt   . Heart disease Maternal Uncle   . Diabetes Maternal Uncle   . Heart disease Maternal Grandmother     pacemaker, stents  . Diabetes Maternal Grandmother   . Heart disease Maternal Grandfather 40  . Diabetes Cousin     Allergies  Allergen Reactions  . Codeine     REACTION: could not stay alert    Medication list has  been reviewed and updated.  Outpatient Prescriptions Prior to Visit  Medication Sig Dispense Refill  . cetirizine (ZYRTEC) 10 MG tablet Take 10 mg by mouth daily as needed.      Marland Kitchen ibuprofen (ADVIL,MOTRIN) 200 MG tablet OTC as directed       . amoxicillin (AMOXIL) 500 MG capsule TAKE 1 CAPSULE BY MOUTH 2 TIMES A DAY  14 capsule  2   No facility-administered medications prior to visit.    Review of Systems:  ROS: GEN: Acute illness details above GI: Tolerating PO intake GU: maintaining adequate hydration and urination Pulm: No SOB Interactive and getting along well at home.  Otherwise, ROS is as per the HPI.   Physical Examination: BP 120/72  Pulse 71  Temp(Src) 98.2 F (36.8 C) (Oral)  Ht 5' 5.5" (1.664 m)  Wt 153 lb 8 oz (69.627 kg)  BMI 25.15 kg/m2  SpO2 98%  Ideal Body Weight: Weight in (lb) to have BMI = 25: 152.2   Gen: WDWN, NAD; A & O x3, cooperative. Pleasant.Globally Non-toxic HEENT: Normocephalic and atraumatic. Throat clear, w/o exudate, R TM indistinct and red, L TM - no cone of light, poor landmarks, notably red. rhinnorhea.  MMM Frontal sinuses: NT Max sinuses: NT NECK: Anterior cervical  LAD is absent CV: RRR, No M/G/R, cap refill <2 sec PULM: Breathing comfortably in no respiratory distress. no wheezing, crackles, rhonchi EXT: No c/c/e PSYCH: Friendly, good eye contact MSK: Nml gait    Assessment and Plan:  Otitis media, bilateral  b om, failure amox, may have been due to low dose? omnicef  Orders Today:  No orders of the defined types were placed in this encounter.    Updated Medication List: (Includes new medications, updates to list, dose adjustments) Meds ordered this encounter  Medications  . cefdinir (OMNICEF) 300 MG capsule    Sig: Take 2 capsules (600 mg total) by mouth daily.    Dispense:  20 capsule    Refill:  0    Medications Discontinued: Medications Discontinued During This Encounter  Medication Reason  . amoxicillin  (AMOXIL) 500 MG capsule Error      Signed, Deanne Bedgood T. Domnic Vantol, MD 01/22/2013 4:12 PM

## 2013-01-27 ENCOUNTER — Other Ambulatory Visit: Payer: Self-pay | Admitting: Gynecology

## 2013-01-27 DIAGNOSIS — N83209 Unspecified ovarian cyst, unspecified side: Secondary | ICD-10-CM

## 2013-01-30 ENCOUNTER — Telehealth: Payer: Self-pay | Admitting: Family Medicine

## 2013-01-30 NOTE — Telephone Encounter (Signed)
Complete the abx - make appt on Monday for a re check to see if she still has infection or if it is just congestion causing the pain  If worse or fever - call for sat clinic appt at [facility]

## 2013-01-30 NOTE — Telephone Encounter (Signed)
Patient Information:  Caller Name: [place]  Phone: [phone]  Patient: Brooke Morrow, Brooke Morrow  Gender: Female  DOB: 01-20-1956  Age: 57 Years  PCP: Roxy Manns [facility])  Pregnant: No  Office Follow Up:  Does the office need to follow up with this patient?: Yes  Instructions For The Office: Wondering if another antibiotic needs to be called in. Still having headaches and ear pain after almost completing RX Cefdinir. Appointments full.   Symptoms  Reason For Call & Symptoms: Seen in office last week and dx with double ear infection and put on Cefdinir 300 mgs 1 PO BID-started on 01/23/12. She is feeling better but still having some ear pain, headaches, and feels tired.  Reviewed Health History In EMR: Yes  Reviewed Medications In EMR: Yes  Reviewed Allergies In EMR: Yes  Reviewed Surgeries / Procedures: Yes  Date of Onset of Symptoms: 01/22/2013  Treatments Tried: Day Almyra Brace  Treatments Tried Worked: No OB / GYN:  LMP: Unknown  Guideline(s) Used:  Itching - Widespread  Earache  Disposition Per Guideline:   See Today in Office  Reason For Disposition Reached:   All other earaches (Exceptions: earache lasting < 1 hour, and earache from air travel)  Advice Given:  Call Back If  Earache last more than 1 hour  High fever, severe headache, or stiff neck occurs  You become worse.  Call Back If:  Ear congestion, pressure, or pain lasts over 48 hours  Fever or severe ear pain occurs  You become worse.

## 2013-01-30 NOTE — Telephone Encounter (Signed)
Pt's home # was busy and pt's cell # voicemail was full, called pt's husband and advise him of Dr. Royden Purl comments

## 2013-02-02 ENCOUNTER — Ambulatory Visit (INDEPENDENT_AMBULATORY_CARE_PROVIDER_SITE_OTHER): Payer: 59

## 2013-02-02 ENCOUNTER — Encounter: Payer: Self-pay | Admitting: Internal Medicine

## 2013-02-02 ENCOUNTER — Other Ambulatory Visit: Payer: Self-pay | Admitting: Gynecology

## 2013-02-02 ENCOUNTER — Encounter: Payer: Self-pay | Admitting: Gynecology

## 2013-02-02 ENCOUNTER — Ambulatory Visit (INDEPENDENT_AMBULATORY_CARE_PROVIDER_SITE_OTHER): Payer: 59 | Admitting: Gynecology

## 2013-02-02 DIAGNOSIS — N831 Corpus luteum cyst of ovary, unspecified side: Secondary | ICD-10-CM

## 2013-02-02 DIAGNOSIS — D3910 Neoplasm of uncertain behavior of unspecified ovary: Secondary | ICD-10-CM

## 2013-02-02 DIAGNOSIS — D3911 Neoplasm of uncertain behavior of right ovary: Secondary | ICD-10-CM

## 2013-02-02 DIAGNOSIS — N83209 Unspecified ovarian cyst, unspecified side: Secondary | ICD-10-CM

## 2013-02-02 DIAGNOSIS — N959 Unspecified menopausal and perimenopausal disorder: Secondary | ICD-10-CM

## 2013-02-02 DIAGNOSIS — N838 Other noninflammatory disorders of ovary, fallopian tube and broad ligament: Secondary | ICD-10-CM

## 2013-02-02 DIAGNOSIS — N839 Noninflammatory disorder of ovary, fallopian tube and broad ligament, unspecified: Secondary | ICD-10-CM

## 2013-02-02 NOTE — Patient Instructions (Signed)
Followup for annual exam this coming fall. We will discuss scheduling ultrasound at that time.

## 2013-02-02 NOTE — Progress Notes (Signed)
Patient presents for followup ultrasound. Previously seen by Dr. Eda Paschal. Had some left lower quadrant pain with ultrasound in October showing a 20 mm right ovarian cyst. Her pain has resolved and followup ultrasound in December showed an 18 mm cyst. She was asked to come back in several months for repeat ultrasound. She's not having pain or other symptoms.  Had [facility] in September which was normal.  Ultrasound shows right ovary with thick walled cystic mass 16mm mean with peripheral flow. Left ovaries atrophic.  Assessment and plan: Persistent right ovarian cystic area with internal echoes. Seems to be slowly getting smaller over time. Patient is asymptomatic. Discussed differential to include persistent functional cyst versus endometrioma. Doubt tumor as certainly not getting larger and appears to be getting smaller. Options for management including surgery reviewed. Patient's comfortable with observation but did ask for a six-month followup ultrasound which I think is reasonable. She is due for her annual in the fall and she'll come back for that we'll rediscuss and tentatively plan on scheduling a followup ultrasound at that time.

## 2013-03-03 ENCOUNTER — Encounter: Payer: Self-pay | Admitting: Internal Medicine

## 2013-03-03 ENCOUNTER — Ambulatory Visit (INDEPENDENT_AMBULATORY_CARE_PROVIDER_SITE_OTHER): Payer: 59 | Admitting: Internal Medicine

## 2013-03-03 VITALS — BP 125/78 | HR 73 | Temp 98.1°F | Ht 66.0 in | Wt 151.0 lb

## 2013-03-03 DIAGNOSIS — K120 Recurrent oral aphthae: Secondary | ICD-10-CM

## 2013-03-03 MED ORDER — AMOXICILLIN 500 MG PO CAPS: 500.0000 mg | ORAL_CAPSULE | Freq: Two times a day (BID) | ORAL | Status: DC

## 2013-03-03 NOTE — Progress Notes (Signed)
  Subjective:    Patient ID: Brooke Morrow, female    DOB: 07-13-1956, 57 y.o.   MRN: [id number]  HPI Since here for followup of her aphthous ulcers. She continues to have problems with these periodically with fairly regular outbreaks and outbreaks occur with either one solitary lesion or several lesions of that are on her tongue and cheeks. No associated fever. She has previously had a complete workup including blood work and colonoscopy. She has had biopsies which were negative for celiac disease. She does continue to tell me that amoxicillin does significantly decrease the duration of outbreaks.   Review of Systems  Constitutional: Negative for fever, fatigue and unexpected weight change.  HENT: Positive for mouth sores.   Respiratory: Negative for cough and shortness of breath.   Gastrointestinal: Positive for diarrhea. Negative for abdominal pain.  Neurological: Negative for dizziness and headaches.       Objective:   Physical Exam  Constitutional: She appears well-developed and well-nourished.  HENT:  A 3-4 mm ulcerated lesion on her upper left lip inside the mouth          Assessment & Plan:

## 2013-03-03 NOTE — Assessment & Plan Note (Signed)
She continues to have issues with this though these do seem to respond well to amoxicillin. I therefore will give her a supply of amoxicillin with refills. She is going to talk to her primary physician to see if she will continue the prescription, otherwise I will see her yearly to continue the prescription past April of 2015.

## 2013-06-01 ENCOUNTER — Telehealth: Payer: Self-pay

## 2013-06-01 NOTE — Telephone Encounter (Signed)
Pt was advise of your recommendations and to stop the abx .Pt said she has been to the GI doc before she went to Dr. Luciana Axe, the GI doctor did 2 colonoscopies in the same year and tested her for everything they could think of and every thing came back normal, pt has an appt with you tomorrow but wanted to know should she do anything different, because she doesn't want to go back to GI since they didn't find anything

## 2013-06-01 NOTE — Telephone Encounter (Signed)
Tell her I got word back from Dr Luciana Axe- he adv no more antibiotics - does not think they will help  Diarrhea may be due to the amox or an underlying GI issue that could cause the ulcers in mouth as well- and I think a ref to GI is a good idea for further eval Let me know if agreeable and also let me know if diarrhea does not improve after stopping amox

## 2013-06-01 NOTE — Telephone Encounter (Signed)
I am not sure what the aphthous ulcers were caused by and with diarrhea wonder if it is more like crohn's disease or something that is just now coming out?  C diff is also a possibility.  I would not change antibiotics as I don't really think antibiotics are the answer if it is not working now.  Maybe a GI evaluation or a biopsy of the lesions by a dermatologist.

## 2013-06-01 NOTE — Telephone Encounter (Signed)
If GI already worked her up - then Dr Luciana Axe recommended a dermatology consult to eval the mouth ulcers and possibly do a biopsy (I think ENT would also be appropriate for that)- in any case we will discuss it at her visit tomorrow and make a plan

## 2013-06-01 NOTE — Telephone Encounter (Signed)
I really do not know what the next step would be if the amox is not working - so please advise her to call Dr Comer's office to see what they recommend I will also route this note there, thanks

## 2013-06-01 NOTE — Telephone Encounter (Signed)
Pt saw Dr Luciana Axe 03/03/13 and given amoxicillin with refills for aphthous ulcers.pt has been taking Amoxicillin for 1 week; symptoms of mouth not any better and stomach issues;diarrhea continue. Pt wants to know if needs to change medications. Pt scheduled appt on 06/02/13 with Dr Milinda Antis.Pt wants Dr Milinda Antis to take over care of ulcers on mouth.

## 2013-06-02 ENCOUNTER — Encounter: Payer: Self-pay | Admitting: Family Medicine

## 2013-06-02 ENCOUNTER — Ambulatory Visit (INDEPENDENT_AMBULATORY_CARE_PROVIDER_SITE_OTHER): Payer: 59 | Admitting: Family Medicine

## 2013-06-02 VITALS — BP 108/68 | HR 65 | Temp 97.6°F | Ht 66.0 in | Wt 153.0 lb

## 2013-06-02 DIAGNOSIS — F430 Acute stress reaction: Secondary | ICD-10-CM

## 2013-06-02 DIAGNOSIS — K120 Recurrent oral aphthae: Secondary | ICD-10-CM

## 2013-06-02 MED ORDER — MAGIC MOUTHWASH W/LIDOCAINE: 5.0000 mL | Freq: Three times a day (TID) | ORAL | Status: DC | PRN

## 2013-06-02 NOTE — Assessment & Plan Note (Signed)
Disc role of stress and anxiety in her development of IBS and apthous ulcer flares Offered counseling and disc coping techniques

## 2013-06-02 NOTE — Progress Notes (Signed)
Subjective:    Patient ID: Brooke Morrow, female    DOB: [birth year], 57 y.o.   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  This round is not regressing like it usually does  Makes her face swollen   She thinks this episode came from RMSF  Also stress related - much of that lately  Very busy- on the road all week last week  She is very tired   She needs to figure out how to manage it better   This is the worst episode in over a year   Sometimes has diarrhea or headaches as well  Eats a good diet  She does exercise 3 d per week    amox is not helping this time  Using an oral B mouthwash - even during the night  pepto and imodium for diarrhea  Also has altered her diet -more bland in general   Has not tried [facility]ukes magic mouthwash or probiotics   Is eating a lot of strawberries lately   Patient Active Problem List   Diagnosis Date Noted  . Fibroid   . Rectocele   . Stress reaction 03/04/2012  . 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 Tick bite 03/02/2011  . 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 HEMOCCULT POSITIVE STOOL 10/22/2009   Past Medical History  Diagnosis Date  . Heart murmur     faint  . RMSF [facility] spotted fever)   . Fibroid   . Rectocele    Past Surgical History  Procedure Laterality Date  . Vaginal hysterectomy    . Cesarean section    . Tonsillectomy     History  Substance Use Topics  . Smoking status: Former Smoker    Quit date: 02/27/2006  . Smokeless tobacco: Never Used  . Alcohol Use: Yes     Comment: occasional wine   Family History  Problem Relation Age of Onset  . Heart disease Mother 50    bypass and stents   . Hypertension Mother   . Heart disease Maternal Aunt   . Diabetes Maternal Aunt   . Heart disease Maternal Uncle   . Diabetes Maternal Uncle   . Heart disease  Maternal Grandmother     pacemaker, stents  . Diabetes Maternal Grandmother   . Heart disease Maternal Grandfather 40  . Diabetes Cousin    Allergies  Allergen Reactions  . Codeine     REACTION: could not stay alert   Current Outpatient Prescriptions on File Prior to Visit  Medication Sig Dispense Refill  . cetirizine (ZYRTEC) 10 MG tablet Take 10 mg by mouth daily as needed.      Marland Kitchen ibuprofen (ADVIL,MOTRIN) 200 MG tablet OTC as directed        No current facility-administered medications on file prior to visit.    Review of Systems Review of Systems  Constitutional: Negative for fever, appetite change,  and unexpected weight change. pos for fatigue  Eyes: Negative for pain and visual disturbance.  ENT neg for ear or throat pain  Respiratory: Negative for cough and shortness of breath.   Cardiovascular: Negative for cp or palpitations    Gastrointestinal: Negative for nausea, diarrhea and constipation.  Genitourinary: Negative for urgency and frequency.  Skin: Negative for pallor or rash   Neurological: Negative for  weakness, light-headedness, numbness and headaches.  Hematological: Negative for adenopathy. Does not bruise/bleed easily.  Psychiatric/Behavioral: Negative for dysphoric mood. The patient is not nervous/anxious.  pos for stressors       Objective:   Physical Exam  Constitutional: She appears well-developed and well-nourished. No distress.  HENT:  Head: Normocephalic and atraumatic.  Right Ear: External ear normal.  Left Ear: External ear normal.  Nose: Nose normal.  Mouth/Throat: Oropharynx is clear and moist.  apthous ulcers of .5 cm in size- white base- seen upon inner lower lip and also bilat buccal mucosae   Eyes: Conjunctivae and EOM are normal. Pupils are equal, round, and reactive to light. Right eye exhibits no discharge. Left eye exhibits no discharge. No scleral icterus.  Neck: Normal range of motion. Neck supple. No thyromegaly present.   Cardiovascular: Normal rate and regular rhythm.   Pulmonary/Chest: Effort normal and breath sounds normal.  Abdominal: Soft. Bowel sounds are normal.  Musculoskeletal: She exhibits no edema.  Lymphadenopathy:    She has no cervical adenopathy.  Skin: Skin is warm and dry. No rash noted. No erythema. No pallor.  Mouth aphthous ulcers noted   Psychiatric: She has a normal mood and affect.  Pt seems stressed but not overly anxious or depressed           Assessment & Plan:

## 2013-06-02 NOTE — Patient Instructions (Addendum)
Get align probiotic over the counter and take as directed  Use the "magic mouthwash" as needed for flares If you are interested in ENT consult let me know If you are interested in counseling for stress let me know

## 2013-06-02 NOTE — Assessment & Plan Note (Signed)
Ongoing with flares in stressful times and no imp with amox Has seen ID and GI with work ups for this  Given magic mouthwash and urged to try a probiotic Disc stressors  Would consider ENT consult if no imp

## 2013-06-07 ENCOUNTER — Encounter: Payer: Self-pay | Admitting: Family Medicine

## 2013-06-08 ENCOUNTER — Telehealth: Payer: Self-pay | Admitting: Family Medicine

## 2013-06-08 DIAGNOSIS — K120 Recurrent oral aphthae: Secondary | ICD-10-CM

## 2013-06-08 NOTE — Telephone Encounter (Signed)
Ref to ENT at pt's request

## 2013-07-01 ENCOUNTER — Encounter: Payer: Self-pay | Admitting: Family Medicine

## 2013-09-01 ENCOUNTER — Encounter: Payer: BC Managed Care – PPO | Admitting: Gynecology

## 2013-09-09 ENCOUNTER — Encounter: Payer: Self-pay | Admitting: Gynecology

## 2013-09-22 ENCOUNTER — Encounter: Payer: Self-pay | Admitting: Gynecology

## 2013-10-08 ENCOUNTER — Other Ambulatory Visit: Payer: Self-pay

## 2013-10-27 ENCOUNTER — Ambulatory Visit (INDEPENDENT_AMBULATORY_CARE_PROVIDER_SITE_OTHER): Payer: 59 | Admitting: Gynecology

## 2013-10-27 ENCOUNTER — Encounter: Payer: Self-pay | Admitting: Gynecology

## 2013-10-27 VITALS — BP 112/66 | Ht 66.0 in | Wt 149.0 lb

## 2013-10-27 DIAGNOSIS — N83209 Unspecified ovarian cyst, unspecified side: Secondary | ICD-10-CM

## 2013-10-27 DIAGNOSIS — Z01419 Encounter for gynecological examination (general) (routine) without abnormal findings: Secondary | ICD-10-CM

## 2013-10-27 LAB — COMPREHENSIVE METABOLIC PANEL
ALT: 10 U/L (ref 0–35)
AST: 17 U/L (ref 0–37)
Alkaline Phosphatase: 42 U/L (ref 39–117)
Creat: 0.77 mg/dL (ref 0.50–1.10)
Sodium: 139 mEq/L (ref 135–145)
Total Bilirubin: 0.8 mg/dL (ref 0.3–1.2)
Total Protein: 6.6 g/dL (ref 6.0–8.3)

## 2013-10-27 LAB — CBC WITH DIFFERENTIAL/PLATELET
Basophils Absolute: 0 10*3/uL (ref 0.0–0.1)
Basophils Relative: 0 % (ref 0–1)
Eosinophils Absolute: 0.1 10*3/uL (ref 0.0–0.7)
Eosinophils Relative: 2 % (ref 0–5)
Hemoglobin: 12.4 g/dL (ref 12.0–15.0)
MCH: 30.2 pg (ref 26.0–34.0)
MCHC: 34.3 g/dL (ref 30.0–36.0)
MCV: 88.3 fL (ref 78.0–100.0)
Monocytes Relative: 7 % (ref 3–12)
Neutrophils Relative %: 61 % (ref 43–77)
Platelets: 270 10*3/uL (ref 150–400)
RBC: 4.1 MIL/uL (ref 3.87–5.11)
RDW: 13.7 % (ref 11.5–15.5)

## 2013-10-27 LAB — LIPID PANEL
LDL Cholesterol: 116 mg/dL — ABNORMAL HIGH (ref 0–99)
Total CHOL/HDL Ratio: 3 Ratio
VLDL: 17 mg/dL (ref 0–40)

## 2013-10-27 LAB — TSH: TSH: 1.99 u[IU]/mL (ref 0.350–4.500)

## 2013-10-27 NOTE — Patient Instructions (Signed)
Followup for ultrasound as scheduled. Otherwise followup in one year for annual exam. Schedule your mammography now.

## 2013-10-27 NOTE — Progress Notes (Signed)
ARANTXA PIERCEY 1956/09/26 [id number]        57 y.o.  [id number] for annual exam.  Former patient of Dr. Eda Paschal. Several issues noted below.  Past medical history,surgical history, problem list, medications, allergies, family history and social history were all reviewed and documented in the EPIC chart.  ROS:  Performed and pertinent positives and negatives are included in the history, assessment and plan .  Exam: Kim assistant Filed Vitals:   10/27/13 1205  BP: 112/66  Height: 5\' 6"  (1.676 m)  Weight: 149 lb (67.586 kg)   General appearance  Normal Skin grossly normal Head/Neck normal with no cervical or supraclavicular adenopathy thyroid normal Lungs  clear Cardiac RR, without RMG Abdominal  soft, nontender, without masses, organomegaly or hernia Breasts  examined lying and sitting without masses, retractions, discharge or axillary adenopathy. Pelvic  Ext/BUS/vagina  Normal    Adnexa  Without masses or tenderness    Anus and perineum  Normal   Rectovaginal  Normal sphincter tone without palpated masses or tenderness.    Assessment/Plan:  57 y.o. [id number] female for annual exam.   1. History of vaginal hysterectomy with posterior colporrhaphy 2007 for leiomyoma and rectocele. Is doing well without significant hot flashes night sweats vaginal dryness or dyspareunia.  Had [facility] checked last year by Dr. Eda Paschal and it was normal. We'll recheck [facility] today. 2. History of right ovarian cyst.  Previously seen by Dr. Eda Paschal. Had some left lower quadrant pain with ultrasound in October 2013 showing a 20 mm right ovarian cyst. Her pain has resolved and followup ultrasound in December 2013 showed an 18 mm cyst. Followup Ultrasound March 2014 showed right ovary with thick walled cystic mass 16mm mean with peripheral flow. Left ovaries atrophic. Plan was to repeat ultrasound at 6 month interval and she is due for this now on I went ahead and scheduled that and she knows importance of  followup. Again she understands we cannot guarantee the pathology but as long as it's remaining stable and she is asymptomatic we can continue to follow this versus excise it which she is not interested in. 3. Mammography due now she knows to schedule it. SBE monthly reviewed. 4. Pap smear 2011. No Pap smear done today. No history of abnormal Pap smears previously. Status post hysterectomy for benign indication. Options to stop screening altogether reviewed per current screening guidelines and she is comfortable with this. 5. Colonoscopy 2012. Repeat at their recommended interval. 6. DEXA never. Patient with normal FSH last year. We'll plan baseline DEXA age 57. Increase calcium vitamin D reviewed. 7. Health maintenance. Baseline CBC comprehensive metabolic panel lipid profile urinalysis TSH vitamin D FSH ordered. Followup for ultrasound otherwise annually.    Note: This document was prepared with digital dictation and possible smart phrase technology. Any transcriptional errors that result from this process are unintentional.   Dara Lords MD, 12:22 PM 10/27/2013

## 2013-10-28 LAB — URINALYSIS W MICROSCOPIC + REFLEX CULTURE
Bilirubin Urine: NEGATIVE
Glucose, UA: NEGATIVE mg/dL
Leukocytes, UA: NEGATIVE
Protein, ur: NEGATIVE mg/dL
Squamous Epithelial / LPF: NONE SEEN
Urobilinogen, UA: 0.2 mg/dL (ref 0.0–1.0)

## 2013-10-28 LAB — FOLLICLE STIMULATING HORMONE: FSH: 12.5 m[IU]/mL

## 2013-10-28 LAB — VITAMIN D 25 HYDROXY (VIT D DEFICIENCY, FRACTURES): Vit D, 25-Hydroxy: 39 ng/mL (ref 30–89)

## 2013-12-07 ENCOUNTER — Encounter: Payer: Self-pay | Admitting: Gynecology

## 2013-12-07 ENCOUNTER — Ambulatory Visit (INDEPENDENT_AMBULATORY_CARE_PROVIDER_SITE_OTHER): Payer: 59

## 2013-12-07 ENCOUNTER — Ambulatory Visit (INDEPENDENT_AMBULATORY_CARE_PROVIDER_SITE_OTHER): Payer: 59 | Admitting: Gynecology

## 2013-12-07 ENCOUNTER — Other Ambulatory Visit: Payer: Self-pay | Admitting: Gynecology

## 2013-12-07 DIAGNOSIS — N8300 Follicular cyst of ovary, unspecified side: Secondary | ICD-10-CM

## 2013-12-07 DIAGNOSIS — N83209 Unspecified ovarian cyst, unspecified side: Secondary | ICD-10-CM

## 2013-12-07 DIAGNOSIS — N831 Corpus luteum cyst of ovary, unspecified side: Secondary | ICD-10-CM

## 2013-12-07 NOTE — Progress Notes (Signed)
Patient presents for ultrasound followup. History of lower pelvic discomfort that comes and goes and a right ovarian cyst right ovary 16 mm 01/2013. Patient notes she is not having any discomfort but does feel that she is ovulating at times due to hormonal-type symptomatology. Her [place] was 12 10/2013.  Ultrasound today shows left ovary with small follicles 8 mm and 12 mm. Also a corpus luteal appearing cyst at 17 mm. Right ovary is normal with resolution of the prior cyst. Cul-de-sac negative.  Assessment and plan: Physiologic changes. Patient doing well without discomfort or other symptoms. We'll continue to monitor and represent in November when she is due for her annual.

## 2013-12-07 NOTE — Patient Instructions (Signed)
Followup in November 2015 for annual exam. Sooner if any issues. 

## 2014-03-04 ENCOUNTER — Encounter: Payer: Self-pay | Admitting: Family Medicine

## 2014-03-06 ENCOUNTER — Emergency Department (HOSPITAL_COMMUNITY)
Admission: EM | Admit: 2014-03-06 | Discharge: 2014-03-06 | Disposition: A | Discharge status: Home / Self Care | Payer: 59 | Source: Home / Self Care | Attending: Family Medicine | Admitting: Family Medicine

## 2014-03-06 ENCOUNTER — Encounter (HOSPITAL_COMMUNITY): Payer: Self-pay | Admitting: Emergency Medicine

## 2014-03-06 DIAGNOSIS — T148 Other injury of unspecified body region: Secondary | ICD-10-CM

## 2014-03-06 DIAGNOSIS — W57XXXA Bitten or stung by nonvenomous insect and other nonvenomous arthropods, initial encounter: Secondary | ICD-10-CM

## 2014-03-06 MED ORDER — HYDROCORTISONE 2.5 % EX CREA: TOPICAL_CREAM | Freq: Two times a day (BID) | CUTANEOUS | Status: DC

## 2014-03-06 NOTE — ED Provider Notes (Signed)
Medical screening examination/treatment/procedure(s) were performed by resident physician or non-physician practitioner and as supervising physician I was immediately available for consultation/collaboration.   Meara Wiechman DOUGLAS MD.   Tyrika Newman D Jamiyah Dingley, MD 03/06/14 1755 

## 2014-03-06 NOTE — Discharge Instructions (Signed)
The spot on your side looks like a reaction to a bug bite - NOT a tick bite. Use the hydrocortisone on it twice a day to help it clear up.  If the ulcers in your mouth get worse or you develop a diffuse rash all over your body, please see a doctor. Follow up with your doctor next week.

## 2014-03-06 NOTE — ED Notes (Signed)
Noticed lesion to right side today with surrounding discoloration.  Denies bug bite, pain, or pruritis.  Wants lesion evaluated due to hx [facility] Spotty Fever.  Also currently being treated for URI - on Augmentin, azelastine, fluticasone.  Also has hx oral lesions R/T digestion on occasion ever since [facility]SF dx 6 yrs ago; reports active oral lesions since starting Augmentin this week.

## 2014-03-06 NOTE — ED Provider Notes (Signed)
CSN: [id number]     Arrival date & time 03/06/14  1422 History   First MD Initiated Contact with Patient 03/06/14 1513     Chief Complaint  Patient presents with  . Skin Discoloration   (Consider location/radiation/quality/duration/timing/severity/associated sxs/prior Treatment) HPI  She reports an area of skin redness on her right hip.  She was seen at another urgent care 2 days ago and diagnosed with an ear infection and allergic rhinitis vs viral URI.  She was started on a nasal spray and Augmentin. She states that the Augmentin has caused some GI discomfort and diarrhea.  She just noticed the spot on her hip today, denies any itching or swelling or pain.  No known bug bite.  She also has some sores in her mouth that showed up 2 days after starting the Augmentin. 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  She states she lesions are the same as her typical lesions.  No fevers.  No rash anywhere else.  Past Medical History  Diagnosis Date  . Heart murmur     faint  . RMSF [facility] spotted fever)    Past Surgical History  Procedure Laterality Date  . Cesarean section    . Tonsillectomy    . Vaginal hysterectomy  2007    Posterior colporrhaphy for leiomyoma and rectocele   Family History  Problem Relation Age of Onset  . Heart disease Mother 40    bypass and stents   . Hypertension Mother   . Heart disease Maternal Aunt   . Diabetes Maternal Aunt   . Heart disease Maternal Uncle   . Diabetes Maternal Uncle   . Heart disease Maternal Grandmother     pacemaker, stents  . Diabetes Maternal Grandmother   . Heart disease Maternal Grandfather 53  . Diabetes Cousin    History  Substance Use Topics  . Smoking status: Former Smoker    Quit date: 02/27/2006  . Smokeless tobacco: Never Used  . Alcohol Use: Yes     Comment: occasional wine   OB History   Grav Para Term Preterm Abortions TAB SAB Ect Mult Living   3 2 2  1     2       Review of Systems  Constitutional: Negative for fever, activity change and appetite change.  HENT: Positive for ear pain, mouth sores, postnasal drip and rhinorrhea. Negative for sore throat.   Eyes: Negative.   Skin: Positive for rash.    Allergies  Codeine  Home Medications   Current Outpatient Rx  Name  Route  Sig  Dispense  Refill  . Amoxicillin-Pot Clavulanate (AUGMENTIN PO)   Oral   Take by mouth.         . fluticasone (FLONASE) 50 MCG/ACT nasal spray   Each Nare   Place into both nostrils daily.         Marland Kitchen ibuprofen (ADVIL,MOTRIN) 200 MG tablet      OTC as directed          . Alum & Mag Hydroxide-Simeth (MAGIC MOUTHWASH W/LIDOCAINE) SOLN   Oral   Take 5 mLs by mouth 3 (three) times daily as needed. Swish in mouth and spit out   120 mL   1   . bismuth subsalicylate (PEPTO BISMOL) 262 MG/15ML suspension   Oral   Take 15 mLs by mouth as needed.         . cetirizine (ZYRTEC) 10 MG tablet  Oral   Take 10 mg by mouth daily as needed.         . hydrocortisone 2.5 % cream   Topical   Apply topically 2 (two) times daily. For 1 week   30 g   0   . Loperamide HCl (IMODIUM PO)   Oral   Take by mouth as needed.          BP 130/86  Pulse 64  Temp(Src) 98.4 F (36.9 C) (Oral)  Resp 17  SpO2 98% Physical Exam  Constitutional: She appears well-developed and well-nourished. No distress.  HENT:  Head: Normocephalic and atraumatic.  aphthous ulcers on buccal mucosa and tongue  Neck: Neck supple.  Skin: Skin is warm and dry. Rash (single lesion with cental lesion surrounded by mild erythema, no swelling or discharge, on right hip.  No induration or mass) noted. She is not diaphoretic.    ED Course  Procedures (including critical care time) Labs Review Labs Reviewed - No data to display Imaging Review No results found.   MDM   1. Bug bite    Likely a bug bite.  No underlying mass to suggest tick bite.  No diffuse rash to suggest drug  reaction. Will treat with cortisone cream. Reviewed reasons to return including fevers, diffuse rash, worsening or changing mouth lesions. F/u with PCP in the next week.    Melony Overly, MD 03/06/14 [phone]

## 2014-03-11 ENCOUNTER — Other Ambulatory Visit: Payer: Self-pay

## 2014-03-11 DIAGNOSIS — Z1231 Encounter for screening mammogram for malignant neoplasm of breast: Secondary | ICD-10-CM

## 2014-03-12 ENCOUNTER — Ambulatory Visit
Admission: RE | Admit: 2014-03-12 | Discharge: 2014-03-12 | Disposition: A | Discharge status: Home / Self Care | Payer: 59 | Source: Ambulatory Visit

## 2014-03-12 DIAGNOSIS — Z1231 Encounter for screening mammogram for malignant neoplasm of breast: Secondary | ICD-10-CM

## 2014-04-13 ENCOUNTER — Encounter: Payer: Self-pay | Admitting: Family Medicine

## 2014-04-13 ENCOUNTER — Ambulatory Visit (INDEPENDENT_AMBULATORY_CARE_PROVIDER_SITE_OTHER): Payer: 59 | Admitting: Family Medicine

## 2014-04-13 VITALS — BP 122/64 | HR 71 | Temp 98.1°F | Ht 66.0 in | Wt 151.5 lb

## 2014-04-13 DIAGNOSIS — J019 Acute sinusitis, unspecified: Secondary | ICD-10-CM | POA: Insufficient documentation

## 2014-04-13 DIAGNOSIS — K120 Recurrent oral aphthae: Secondary | ICD-10-CM

## 2014-04-13 MED ORDER — AMOXICILLIN-POT CLAVULANATE 875-125 MG PO TABS: 1.0000 | ORAL_TABLET | Freq: Two times a day (BID) | ORAL | Status: DC

## 2014-04-13 NOTE — Progress Notes (Signed)
Subjective:    Patient ID: Brooke Morrow, female    DOB: July 05, 1956, 58 y.o.   MRN: [id number]  HPI Here for sinus and ear problems  [place] from [place] last [place]- started having cough and congestion  Then sinus pain - wonders about a sinus infection  Post nasal drainage  D/c is cloudy  Feels like she has a low grade fever - is on acetaminophen    Using flonase  [facility]  Also a multi symptom cold reliever   She continues amox for apthous ulcers 4-5 times per year - (used to see dr Linus Salmons for this)- and now he wants me to take that over  Also has a mouthwash - compounded (from another specialist)- needs refill and will call back with the name of it     Patient Active Problem List   Diagnosis Date Noted  . Fibroid   . Stress reaction 03/04/2012  . 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 Tick bite 03/02/2011  . 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 HEMOCCULT POSITIVE STOOL 10/22/2009   Past Medical History  Diagnosis Date  . Heart murmur     faint  . RMSF [facility] spotted fever)    Past Surgical History  Procedure Laterality Date  . Cesarean section    . Tonsillectomy    . Vaginal hysterectomy  2007    Posterior colporrhaphy for leiomyoma and rectocele   History  Substance Use Topics  . Smoking status: Former Smoker    Quit date: 02/27/2006  . Smokeless tobacco: Never Used  . Alcohol Use: Yes     Comment: occasional wine   Family History  Problem Relation Age of Onset  . Heart disease Mother 80    bypass and stents   . Hypertension Mother   . Heart disease Maternal Aunt   . Diabetes Maternal Aunt   . Heart disease Maternal Uncle   . Diabetes Maternal Uncle   . Heart disease Maternal Grandmother     pacemaker, stents  . Diabetes Maternal Grandmother   . Heart disease Maternal Grandfather 28  . Diabetes Cousin    Allergies  Allergen Reactions  . Codeine     REACTION: could not stay alert   Current Outpatient Prescriptions on File Prior to  Visit  Medication Sig Dispense Refill  . Alum & Mag Hydroxide-Simeth (MAGIC MOUTHWASH W/LIDOCAINE) SOLN Take 5 mLs by mouth 3 (three) times daily as needed. Swish in mouth and spit out  120 mL  1  . bismuth subsalicylate (PEPTO BISMOL) 262 MG/15ML suspension Take 15 mLs by mouth as needed.      . cetirizine (ZYRTEC) 10 MG tablet Take 10 mg by mouth daily as needed.      . fluticasone (FLONASE) 50 MCG/ACT nasal spray Place into both nostrils daily.      Marland Kitchen ibuprofen (ADVIL,MOTRIN) 200 MG tablet OTC as directed       . Loperamide HCl (IMODIUM PO) Take by mouth as needed.       No current facility-administered medications on file prior to visit.      Review of Systems    Review of Systems  Constitutional: Negative for fever, appetite change, fatigue and unexpected weight change.  ENT pos for cong and rhinorrhea and sinus pain pos for intermittent ulcers of the mouth Eyes: Negative for pain and visual disturbance.  Respiratory: Negative for shortness of breath.   Cardiovascular: Negative for cp or palpitations    Gastrointestinal: Negative for nausea,  diarrhea and constipation.  Genitourinary: Negative for urgency and frequency.  Skin: Negative for pallor or rash   Neurological: Negative for weakness, light-headedness, numbness and headaches.  Hematological: Negative for adenopathy. Does not bruise/bleed easily.  Psychiatric/Behavioral: Negative for dysphoric mood. The patient is not nervous/anxious.      Objective:   Physical Exam  Constitutional: She appears well-developed and well-nourished.  HENT:  Head: Normocephalic and atraumatic.  Right Ear: External ear normal.  Left Ear: External ear normal.  Mouth/Throat: Oropharynx is clear and moist. No oropharyngeal exudate.  Nares are injected and congested  Bilateral maxillary sinus tenderness  Throat- post nasal drip  No mouth ulcers today  Eyes: Conjunctivae and EOM are normal. Pupils are equal, round, and reactive to light.  Right eye exhibits no discharge. Left eye exhibits no discharge. No scleral icterus.  Neck: Normal range of motion. Neck supple.  Cardiovascular: Normal rate and regular rhythm.   Pulmonary/Chest: Effort normal and breath sounds normal. No respiratory distress. She has no wheezes. She has no rales.  Musculoskeletal: She exhibits no edema.  Lymphadenopathy:    She has no cervical adenopathy.  Neurological: She is alert. No cranial nerve deficit.  Skin: Skin is warm and dry. No rash noted. No erythema. No pallor.  Psychiatric: She has a normal mood and affect.          Assessment & Plan:

## 2014-04-13 NOTE — Assessment & Plan Note (Addendum)
Dr Linus Salmons used to see her and gives her amoxicillin for these -it is the only thing that helps - and he wants [place] to take that over  She will call with dosing so we can put that in the chart  Also uses a compounded mouthwash and she will call with that also to add to med list

## 2014-04-13 NOTE — Progress Notes (Signed)
Pre visit review using our clinic review tool, if applicable. No additional management support is needed unless otherwise documented below in the visit note. 

## 2014-04-13 NOTE — Assessment & Plan Note (Signed)
Cover with augmentin  Disc symptomatic care - see instructions on AVS  Update if not starting to improve in a week or if worsening   Continue current seasonal allergy med -zyrtec and flonase

## 2014-04-13 NOTE — Patient Instructions (Addendum)
For allergies continue zyrtec daily and flonase - through the season  For sinus infection take the augmentin as directed/ breathe steam and you can also try nasal saline spray  The multi symptom cold pill is ok  Update if not starting to improve in a week or if worsening   email me info about the amoxicillin and mouthwash

## 2014-04-14 ENCOUNTER — Ambulatory Visit: Payer: 59 | Admitting: Family Medicine

## 2014-05-07 ENCOUNTER — Telehealth: Payer: Self-pay | Admitting: Family Medicine

## 2014-05-07 ENCOUNTER — Encounter: Payer: Self-pay | Admitting: Family Medicine

## 2014-05-07 ENCOUNTER — Ambulatory Visit (INDEPENDENT_AMBULATORY_CARE_PROVIDER_SITE_OTHER): Payer: 59 | Admitting: Family Medicine

## 2014-05-07 VITALS — BP 130/88 | HR 85 | Temp 98.1°F | Wt 154.8 lb

## 2014-05-07 DIAGNOSIS — M25519 Pain in unspecified shoulder: Secondary | ICD-10-CM

## 2014-05-07 DIAGNOSIS — M25512 Pain in left shoulder: Secondary | ICD-10-CM

## 2014-05-07 MED ORDER — MELOXICAM 15 MG PO TABS: 15.0000 mg | ORAL_TABLET | Freq: Every day | ORAL | Status: DC

## 2014-05-07 NOTE — Progress Notes (Signed)
Subjective:    Patient ID: Brooke Morrow, female    DOB: 24-Aug-1956, 58 y.o.   MRN: [id number]  HPI L arm pain and shoulder pain for the past 3 days  Is hurts all the time - - does not feel like a muscle  No chest pain or sob or sweating or nausea   Does hurt to lift with that arm  Nl neck rom   Has been more stressed lately   She is followed by cardiology   Patient Active Problem List   Diagnosis Date Noted  . Acute sinusitis 04/13/2014  . Fibroid   . Stress reaction 03/04/2012  . 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 Tick bite 03/02/2011  . 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 HEMOCCULT POSITIVE STOOL 10/22/2009   Past Medical History  Diagnosis Date  . Heart murmur     faint  . RMSF [facility] spotted fever)    Past Surgical History  Procedure Laterality Date  . Cesarean section    . Tonsillectomy    . Vaginal hysterectomy  2007    Posterior colporrhaphy for leiomyoma and rectocele   History  Substance Use Topics  . Smoking status: Former Smoker    Quit date: 02/27/2006  . Smokeless tobacco: Never Used  . Alcohol Use: Yes     Comment: occasional wine   Family History  Problem Relation Age of Onset  . Heart disease Mother 28    bypass and stents   . Hypertension Mother   . Heart disease Maternal Aunt   . Diabetes Maternal Aunt   . Heart disease Maternal Uncle   . Diabetes Maternal Uncle   . Heart disease Maternal Grandmother     pacemaker, stents  . Diabetes Maternal Grandmother   . Heart disease Maternal Grandfather 81  . Diabetes Cousin    Allergies  Allergen Reactions  . Codeine     REACTION: could not stay alert   Current Outpatient Prescriptions on File Prior to Visit  Medication Sig Dispense Refill  . bismuth subsalicylate (PEPTO BISMOL) 262 MG/15ML suspension Take 15 mLs by mouth as needed.      . cetirizine (ZYRTEC) 10 MG tablet Take 10 mg by mouth daily as needed.      . fluticasone (FLONASE) 50 MCG/ACT nasal spray Place into both nostrils  daily.      . hydrocortisone 2.5 % cream Apply 1 application topically as needed. For 1 week      . ibuprofen (ADVIL,MOTRIN) 200 MG tablet OTC as directed       . Loperamide HCl (IMODIUM PO) Take by mouth as needed.       No current facility-administered medications on file prior to visit.     Review of Systems Review of Systems  Constitutional: Negative for fever, appetite change, fatigue and unexpected weight change.  Eyes: Negative for pain and visual disturbance.  Respiratory: Negative for cough and shortness of breath.   Cardiovascular: Negative for cp or palpitations    Gastrointestinal: Negative for nausea, diarrhea and constipation.  Genitourinary: Negative for urgency and frequency.  Skin: Negative for pallor or rash   MSK pos for L shoulder girdle pain and stiffness  Neurological: Negative for weakness, light-headedness, numbness and headaches.  Hematological: Negative for adenopathy. Does not bruise/bleed easily.  Psychiatric/Behavioral: Negative for dysphoric mood. The patient is not nervous/anxious.         Objective:   Physical Exam  Constitutional: She appears well-developed and well-nourished. No distress.  HENT:  Head: Normocephalic  and atraumatic.  Eyes: Conjunctivae and EOM are normal. Pupils are equal, round, and reactive to light.  Neck: Normal range of motion. Neck supple. No JVD present. Carotid bruit is not present. No thyromegaly present.  Cardiovascular: Normal rate and regular rhythm.   Musculoskeletal: She exhibits tenderness. She exhibits no edema.       Left shoulder: She exhibits decreased range of motion, tenderness, bony tenderness and pain. She exhibits no swelling, no effusion, no crepitus, no deformity and normal strength.       Cervical back: She exhibits tenderness. She exhibits normal range of motion, no bony tenderness, no swelling and no edema.       Left upper arm: She exhibits no tenderness, no bony tenderness and no edema.  L shoulder    Pain to abduct shoulder over 90 deg passively and actively  Pos hawking and neer signs Some acromion tenderness Also tender over L med scapular border with spasm  Tender L trap  Tender L cervical musculature-nl rom neck  Lymphadenopathy:    She has no cervical adenopathy.  Skin: Skin is warm and dry. No rash noted. No erythema. No pallor.  Psychiatric: She has a normal mood and affect.  Anxious appearing - fatigued and stressed           Assessment & Plan:   Problem List Items Addressed This Visit     Other   Left shoulder pain - Primary     Tenderness and dec rom of shoulder girdle on exam  Poss tendonitis  Recommend heat/ice/ passive rom exercises and also meloxicam  Update if not starting to improve in several weeks or if worsening

## 2014-05-07 NOTE — Telephone Encounter (Signed)
Patient Information:  Caller Name: Aerianna  Phone: [phone]  Patient: Panda, Crossin  Gender: Female  DOB: [birth year]  Age: 58 Years  PCP: Loura Pardon [facility])  Pregnant: No  Office Follow Up:  Does the office need to follow up with this patient?: No  Instructions For The Office: N/A  RN Note:  Advised pt per disp Go To ED Now, pt refused and states she is coming to office and someone can see her but she does not want to go to ED.  Contacted the office and spoke with Ebony Hail with heads up on pt coming in against advice.  Symptoms  Reason For Call & Symptoms: Pt reports she is having left arm pain that is radiating to the left shoulder.  Reviewed Health History In EMR: Yes  Reviewed Medications In EMR: Yes  Reviewed Allergies In EMR: Yes  Reviewed Surgeries / Procedures: Yes  Date of Onset of Symptoms: 05/05/2014 OB / GYN:  LMP: Unknown  Guideline(s) Used:  Shoulder Pain  Disposition Per Guideline:   Go to ED Now  Reason For Disposition Reached:   Age > 22 and no obvious cause and pain even when not moving the arm (Exception: pain is clearly made worse by moving arm or bending neck)  Advice Given:  N/A  Patient Will Follow Care Advice:  YES

## 2014-05-07 NOTE — Patient Instructions (Signed)
For shoulder pain/ strain - take the meloxicam 15 mg daily with food as needed  Use ice and heat intermittently 10 minutes at a time  Find a cervical support pillow made of foam if you do not already have one  Update if not starting to improve in 1-2 weeks or if worsening

## 2014-05-07 NOTE — Telephone Encounter (Signed)
I saw her in the office

## 2014-05-07 NOTE — Progress Notes (Signed)
Pre visit review using our clinic review tool, if applicable. No additional management support is needed unless otherwise documented below in the visit note. 

## 2014-05-09 NOTE — Assessment & Plan Note (Signed)
Tenderness and dec rom of shoulder girdle on exam  Poss tendonitis  Recommend heat/ice/ passive rom exercises and also meloxicam  Update if not starting to improve in several weeks or if worsening

## 2014-05-12 ENCOUNTER — Encounter: Payer: Self-pay | Admitting: Family Medicine

## 2014-05-12 ENCOUNTER — Ambulatory Visit (INDEPENDENT_AMBULATORY_CARE_PROVIDER_SITE_OTHER): Payer: 59 | Admitting: Family Medicine

## 2014-05-12 ENCOUNTER — Other Ambulatory Visit: Payer: Self-pay | Admitting: Family Medicine

## 2014-05-12 VITALS — BP 112/68 | HR 66 | Temp 97.6°F | Ht 66.0 in | Wt 152.5 lb

## 2014-05-12 DIAGNOSIS — H9209 Otalgia, unspecified ear: Secondary | ICD-10-CM

## 2014-05-12 DIAGNOSIS — R519 Headache, unspecified: Secondary | ICD-10-CM

## 2014-05-12 DIAGNOSIS — R51 Headache: Secondary | ICD-10-CM

## 2014-05-12 DIAGNOSIS — H9201 Otalgia, right ear: Secondary | ICD-10-CM

## 2014-05-12 NOTE — Progress Notes (Signed)
Subjective:    Patient ID: Brooke Morrow, female    DOB: 07-01-1956, 58 y.o.   MRN: [id number]  HPI Here with ear pain  Started this am - R ear pain - goes down her neck and pain into her temple  [place] Horrible allergies - zyrtec and flonase   She gets ear infections frequently   No fever that she knows of  No chills or sweats   No swimming or water in ear that she knows of   Patient Active Problem List   Diagnosis Date Noted  . Ear pain, right 05/12/2014  . Temple tenderness 05/12/2014  . Left shoulder pain 05/07/2014  . Acute sinusitis 04/13/2014  . Fibroid   . Stress reaction 03/04/2012  . 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 Tick bite 03/02/2011  . 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 HEMOCCULT POSITIVE STOOL 10/22/2009   Past Medical History  Diagnosis Date  . Heart murmur     faint  . RMSF [facility] spotted fever)    Past Surgical History  Procedure Laterality Date  . Cesarean section    . Tonsillectomy    . Vaginal hysterectomy  2007    Posterior colporrhaphy for leiomyoma and rectocele   History  Substance Use Topics  . Smoking status: Former Smoker    Quit date: 02/27/2006  . Smokeless tobacco: Never Used  . Alcohol Use: Yes     Comment: occasional wine   Family History  Problem Relation Age of Onset  . Heart disease Mother 7    bypass and stents   . Hypertension Mother   . Heart disease Maternal Aunt   . Diabetes Maternal Aunt   . Heart disease Maternal Uncle   . Diabetes Maternal Uncle   . Heart disease Maternal Grandmother     pacemaker, stents  . Diabetes Maternal Grandmother   . Heart disease Maternal Grandfather 16  . Diabetes Cousin    Allergies  Allergen Reactions  . Codeine     REACTION: could not stay alert   Current Outpatient Prescriptions on File Prior to Visit  Medication Sig Dispense Refill  . bismuth subsalicylate (PEPTO BISMOL) 262 MG/15ML suspension Take 15 mLs by mouth as needed.      . cetirizine  (ZYRTEC) 10 MG tablet Take 10 mg by mouth daily as needed.      . fluticasone (FLONASE) 50 MCG/ACT nasal spray Place into both nostrils daily.      . hydrocortisone 2.5 % cream Apply 1 application topically as needed. For 1 week      . ibuprofen (ADVIL,MOTRIN) 200 MG tablet OTC as directed       . Loperamide HCl (IMODIUM PO) Take by mouth as needed.      . meloxicam (MOBIC) 15 MG tablet Take 1 tablet (15 mg total) by mouth daily. As needed with food for shoulder pain  30 tablet  0   No current facility-administered medications on file prior to visit.    Review of Systems Review of Systems  Constitutional: Negative for fever, appetite change,  and unexpected weight change. ENT pos for rhinorrhea and sneezing and ear pressure/ neg for ST  Eyes: Negative for pain and visual disturbance.  Respiratory: Negative for cough and shortness of breath.   Cardiovascular: Negative for cp or palpitations    Gastrointestinal: Negative for nausea, diarrhea and constipation.  Genitourinary: Negative for urgency and frequency.  Skin: Negative for pallor or rash   Neurological: Negative for  weakness, light-headedness, numbness and headaches.  Hematological: Negative for adenopathy. Does not bruise/bleed easily.  Psychiatric/Behavioral: Negative for dysphoric mood. The patient is not nervous/anxious.         Objective:   Physical Exam  Constitutional: She appears well-developed and well-nourished. No distress.  HENT:  Head: Normocephalic and atraumatic.  Left Ear: External ear normal.  Mouth/Throat: Oropharynx is clear and moist. No oropharyngeal exudate.  Nares are boggy R TM is retracted and dull  Tenderness over R temple No sinus tenderness   Eyes: Conjunctivae and EOM are normal. Pupils are equal, round, and reactive to light. Right eye exhibits no discharge. Left eye exhibits no discharge.  Neck: Normal range of motion. Neck supple.  Cardiovascular: Normal rate and regular rhythm.  Exam reveals  no gallop.   Pulmonary/Chest: Effort normal and breath sounds normal. No respiratory distress. She has no wheezes. She has no rales.  Musculoskeletal: She exhibits no edema.  Lymphadenopathy:    She has no cervical adenopathy.  Neurological: She is alert. She has normal reflexes. No cranial nerve deficit.  Skin: Skin is warm and dry. No rash noted. No erythema. No pallor.  Psychiatric: She has a normal mood and affect.          Assessment & Plan:   Problem List Items Addressed This Visit     Other   Ear pain, right - Primary     Suspect ETD- and pt is prone to OM if not treated Will double her flonase dosing for 2 weeks Use afrin NS for airplane ride upcoming  Disc s/s of OM and sinusitis bacterial -see AVS-pt will call if these develop out of town so we can tx with antibiotic  Update if not starting to improve in a week or if worsening      Temple tenderness     In addition to ear pain has tenderness of temple on R ESR today and update to r/o temp arteritis     Relevant Orders      Sedimentation Rate (Completed)

## 2014-05-12 NOTE — Patient Instructions (Signed)
I think you have eustacian tube dysfunction - you have fluid behind ear drum  This could set you up for a bacterial infection  Increase flonase for 2 weeks to 2 sprays in each nostril twice daily  In addition- get afrin nasal spray and use it as directed 1 hour before take off and 1 hour before landing  If you are out of town and get worse - especially if fever or if drainage from ear or green nasal d/c -please call  We will also do labwork today for sed rate to rule out temporal arteritis

## 2014-05-12 NOTE — Assessment & Plan Note (Signed)
Suspect ETD- and pt is prone to OM if not treated Will double her flonase dosing for 2 weeks Use afrin NS for airplane ride upcoming  Disc s/s of OM and sinusitis bacterial -see AVS-pt will call if these develop out of town so we can tx with antibiotic  Update if not starting to improve in a week or if worsening

## 2014-05-12 NOTE — Telephone Encounter (Signed)
Electronic refill request, Pt was seen today, please advise

## 2014-05-12 NOTE — Telephone Encounter (Signed)
Will refill electronically  

## 2014-05-12 NOTE — Progress Notes (Signed)
Pre visit review using our clinic review tool, if applicable. No additional management support is needed unless otherwise documented below in the visit note. 

## 2014-05-12 NOTE — Assessment & Plan Note (Signed)
In addition to ear pain has tenderness of temple on R ESR today and update to r/o temp arteritis

## 2014-05-13 ENCOUNTER — Other Ambulatory Visit: Payer: Self-pay | Admitting: *Deleted

## 2014-05-13 LAB — SEDIMENTATION RATE: Sed Rate: 10 mm/hr (ref 0–22)

## 2014-08-11 ENCOUNTER — Encounter: Payer: Self-pay | Admitting: Gastroenterology

## 2014-09-01 ENCOUNTER — Other Ambulatory Visit: Payer: Self-pay | Admitting: Family Medicine

## 2014-09-01 NOTE — Telephone Encounter (Signed)
Pt responded to Dr. Glori Bickers via [place] and Dr. Hilbert Corrigan the refill x3, done

## 2014-09-01 NOTE — Telephone Encounter (Signed)
What is it for? Did we originally px it - I found magic mouthwash from 7/14  Thanks

## 2014-09-01 NOTE — Telephone Encounter (Signed)
Electronic refill request, Rx not on med list, please advise 

## 2014-09-02 ENCOUNTER — Encounter: Payer: Self-pay | Admitting: Gastroenterology

## 2014-10-04 ENCOUNTER — Encounter: Payer: Self-pay | Admitting: Family Medicine

## 2015-03-27 ENCOUNTER — Encounter: Payer: Self-pay | Admitting: Family Medicine

## 2015-04-04 ENCOUNTER — Encounter: Payer: Self-pay | Admitting: Family Medicine

## 2015-04-04 ENCOUNTER — Ambulatory Visit (INDEPENDENT_AMBULATORY_CARE_PROVIDER_SITE_OTHER): Payer: No Typology Code available for payment source | Admitting: Family Medicine

## 2015-04-04 ENCOUNTER — Ambulatory Visit (INDEPENDENT_AMBULATORY_CARE_PROVIDER_SITE_OTHER)
Admission: RE | Admit: 2015-04-04 | Discharge: 2015-04-04 | Disposition: A | Discharge status: Home / Self Care | Payer: No Typology Code available for payment source | Source: Ambulatory Visit | Attending: Family Medicine | Admitting: Family Medicine

## 2015-04-04 VITALS — BP 100/68 | HR 69 | Temp 98.5°F | Ht 66.0 in | Wt 152.5 lb

## 2015-04-04 DIAGNOSIS — M79671 Pain in right foot: Secondary | ICD-10-CM | POA: Diagnosis not present

## 2015-04-04 DIAGNOSIS — M722 Plantar fascial fibromatosis: Secondary | ICD-10-CM | POA: Diagnosis not present

## 2015-04-04 NOTE — Progress Notes (Signed)
Dr. Frederico Hamman T. Ameliana Brashear, MD, [facility] [place], [address] Phone: [phone] Fax: [phone]  04/04/2015  Patient: Brooke Morrow, MRN: [id number], DOB: 1955/12/20, 59 y.o.  Primary Physician:  Loura Pardon, MD  Chief Complaint: Foot Pain  Subjective:   Brooke Morrow is a 59 y.o. very pleasant female patient who presents with the following:  R PF started in the fall.  Not successful with home rehab.   The patient has been having right foot pain and right heel pain now for at least 6 months.  This is progressively been getting worse.  She has been doing home rehabilitation that she got off of the Internet, and this does not really help much at all.  She seems of actually had progression of her symptoms and worsening.  Now she is having some pain that is bad enough that it is waking her up from sleep.  She has not had any trauma.  She has not had any injections.  She has not had or started to wearing the orthotics.  Calcaneal stress fx?  Past Medical History, Surgical History, Social History, Family History, Problem List, Medications, and Allergies have been reviewed and updated if relevant.  GEN: No fevers, chills. Nontoxic. Primarily MSK c/o today. MSK: Detailed in the HPI GI: tolerating PO intake without difficulty Neuro: No numbness, parasthesias, or tingling associated. Otherwise the pertinent positives of the ROS are noted above.   Objective:   BP 100/68 mmHg  Pulse 69  Temp(Src) 98.5 F (36.9 C) (Oral)  Ht 5\' 6"  (1.676 m)  Wt 152 lb 8 oz (69.174 kg)  BMI 24.63 kg/m2   GEN: WDWN, NAD, Non-toxic, Alert & Oriented x 3 HEENT: Atraumatic, Normocephalic.  Ears and Nose: No external deformity. EXTR: No clubbing/cyanosis/edema NEURO: Normal gait.  PSYCH: Normally interactive. Conversant. Not depressed or anxious appearing.  Calm demeanor.   FEET: R Echymosis: no Edema: no ROM: full LE  B Gait: heel toe, antalgic MT pain: no Callus pattern: none Lateral Mall: NT Medial Mall: NT Talus: NT Navicular: NT Cuboid: NT Calcaneous: SQUEEZE IS POSITIVE. WITH PALPATION OF MEDIAL AND LATERAL CALCANEOUS, TTP Metatarsals: NT 5th MT: NT Phalanges: NT Achilles: NT Plantar Fascia: TTP Fat Pad: NT Peroneals: NT Post Tib: NT Great Toe: Nml motion Ant Drawer: neg ATFL: NT CFL: NT Deltoid: NT Hindfoot breakdown: none Sensation: intact   Radiology: Dg Os Calcis Right  04/04/2015   CLINICAL DATA:  Right heel pain. Possible calcaneal stress fracture.  EXAM: RIGHT OS CALCIS - 2+ VIEW  COMPARISON:  None.  FINDINGS: The calcaneus is intact. There is no evidence for a fracture. There is a small plantar calcaneal spur. Small sclerotic area along the anterior calcaneus could represent overlying shadows or a small bone island. This is not characteristic for a stress fracture. Soft tissues are unremarkable.  IMPRESSION: No acute bone abnormality.  Calcaneal spur.   Electronically Signed   By: Markus Daft M.D.   On: 04/04/2015 12:48      Assessment and Plan:   Heel pain, right - Plan: DG Os Calcis Right  Plantar fascia syndrome  >25 minutes spent in face to face time with patient, >50% spent in counselling or coordination of care  PF, altered gait likely has led to calcaneous stress reaction on the R. We discussed option, potentially getting MRI vs. Bone scan to confirm, and we opted to treat conservatively clinically. Placed patient in an [place]  Cam fracture boot.   Anticipate 3-6 weeks of immobility.  Follow-up: Return in about 3 weeks (around 04/25/2015).  New Prescriptions   No medications on file   Orders Placed This Encounter  Procedures  . DG Os Calcis Right    Signed,  Tawanna Funk T. Yailene Badia, MD   Patient's Medications  New Prescriptions   No medications on file  Previous Medications   AMOXICILLIN (AMOXIL) 500 MG CAPSULE    Take 1 capsule (500 mg total) by mouth 2  (two) times daily. Prn for mouth ulcer problem   BISMUTH SUBSALICYLATE (PEPTO BISMOL) 262 MG/15ML SUSPENSION    Take 15 mLs by mouth as needed.   CETIRIZINE (ZYRTEC) 10 MG TABLET    Take 10 mg by mouth daily as needed.   FLUTICASONE (FLONASE) 50 MCG/ACT NASAL SPRAY    Place into both nostrils daily.   IBUPROFEN (ADVIL,MOTRIN) 200 MG TABLET    OTC as directed    LIDOCAINE (XYLOCAINE) 2 % SOLUTION    SWISH, GARGLE, & SPIT (DO NOT SWALLOW) 15ML'S BY MOUTH 3 TO 4 TIMES A DAY AS NEEDED FOR ULCERS   LOPERAMIDE HCL (IMODIUM PO)    Take by mouth as needed.  Modified Medications   No medications on file  Discontinued Medications   HYDROCORTISONE 2.5 % CREAM    Apply 1 application topically as needed. For 1 week   MELOXICAM (MOBIC) 15 MG TABLET    Take 1 tablet (15 mg total) by mouth daily. As needed with food for shoulder pain

## 2015-04-04 NOTE — Progress Notes (Signed)
Pre visit review using our clinic review tool, if applicable. No additional management support is needed unless otherwise documented below in the visit note. 

## 2015-04-28 ENCOUNTER — Encounter: Payer: Self-pay | Admitting: Family Medicine

## 2015-04-28 ENCOUNTER — Ambulatory Visit (INDEPENDENT_AMBULATORY_CARE_PROVIDER_SITE_OTHER): Payer: No Typology Code available for payment source | Admitting: Family Medicine

## 2015-04-28 VITALS — BP 101/62 | HR 73 | Temp 98.0°F | Ht 66.0 in | Wt 143.8 lb

## 2015-04-28 DIAGNOSIS — M79671 Pain in right foot: Secondary | ICD-10-CM

## 2015-04-28 DIAGNOSIS — M722 Plantar fascial fibromatosis: Secondary | ICD-10-CM | POA: Diagnosis not present

## 2015-04-28 NOTE — Progress Notes (Signed)
Dr. Frederico Hamman T. Edmund Rick, MD, [facility] [place], [address] Phone: [phone] Fax: [phone]  04/28/2015  Patient: Brooke Morrow, MRN: [id number], DOB: 03-Jun-1956, 59 y.o.  Primary Physician:  Loura Pardon, MD  Chief Complaint: Follow-up  Subjective:   Brooke Morrow is a 59 y.o. very pleasant female patient who presents with the following:  About 80% better.  Calcaneal stress reaction.  The patient has been very compliant with her treatment plan.  She has been in a cam walker boot since our last office visit.  She thinks that she is about 75-80% better.  Her pain in her heel is much improved, and she is no longer having any pain at nighttime.  She also thinks that the plantar fascitis she has been having on the bottom of both heels is also improving.   04/04/2015 Last OV with Owens Loffler, MD  R PF started in the fall.  Not successful with home rehab.   The patient has been having right foot pain and right heel pain now for at least 6 months.  This is progressively been getting worse.  She has been doing home rehabilitation that she got off of the Internet, and this does not really help much at all.  She seems of actually had progression of her symptoms and worsening.  Now she is having some pain that is bad enough that it is waking her up from sleep.  She has not had any trauma.  She has not had any injections.  She has not had or started to wearing the orthotics.  Calcaneal stress fx?  Past Medical History, Surgical History, Social History, Family History, Problem List, Medications, and Allergies have been reviewed and updated if relevant.  GEN: No fevers, chills. Nontoxic. Primarily MSK c/o today. MSK: Detailed in the HPI GI: tolerating PO intake without difficulty Neuro: No numbness, parasthesias, or tingling associated. Otherwise the pertinent positives of the ROS are noted above.    Objective:   BP 101/62 mmHg  Pulse 73  Temp(Src) 98 F (36.7 C) (Oral)  Ht 5\' 6"  (1.676 m)  Wt 143 lb 12 oz (65.205 kg)  BMI 23.21 kg/m2   GEN: WDWN, NAD, Non-toxic, Alert & Oriented x 3 HEENT: Atraumatic, Normocephalic.  Ears and Nose: No external deformity. EXTR: No clubbing/cyanosis/edema NEURO: Normal gait.  PSYCH: Normally interactive. Conversant. Not depressed or anxious appearing.  Calm demeanor.   FEET: R Echymosis: no Edema: no ROM: full LE B Gait: heel toe, antalgic - improved MT pain: no Callus pattern: none Lateral Mall: NT Medial Mall: NT Talus: NT Navicular: NT Cuboid: NT Calcaneous: SQUEEZE IS POSITIVE. WITH PALPATION OF MEDIAL AND LATERAL CALCANEOUS, TTP - THIS IS NOTABLY IMPROVED COMPARED TO HER LAST OFFICE VISIT. Metatarsals: NT 5th MT: NT Phalanges: NT Achilles: NT Plantar Fascia: TTP Fat Pad: NT Peroneals: NT Post Tib: NT Great Toe: Nml motion Ant Drawer: neg ATFL: NT CFL: NT Deltoid: NT Hindfoot breakdown: none Sensation: intact   Radiology: Dg Os Calcis Right  04/04/2015   CLINICAL DATA:  Right heel pain. Possible calcaneal stress fracture.  EXAM: RIGHT OS CALCIS - 2+ VIEW  COMPARISON:  None.  FINDINGS: The calcaneus is intact. There is no evidence for a fracture. There is a small plantar calcaneal spur. Small sclerotic area along the anterior calcaneus could represent overlying shadows or a small bone island. This is not characteristic for a stress fracture. Soft tissues are unremarkable.  IMPRESSION: No acute bone abnormality.  Calcaneal spur.   Electronically Signed   By: Markus Daft M.D.   On: 04/04/2015 12:48      Assessment and Plan:   Heel pain, right  Plantar fascia syndrome  Healing probable calcaneal stress fracture on the right.  Continue to wear her cam walker boot for the next 2 weeks, then titrate out.  She is doing very well.  I will let her follow-up with me if she has problems.  We have gone over limitations with her  in some detail, and I feel confident that she understands.  She has return of symptoms are more pain, she understands that she needs to call me right away.  Signed,  Maud Deed. Vina Byrd, MD   Patient's Medications  New Prescriptions   No medications on file  Previous Medications   AMOXICILLIN (AMOXIL) 500 MG CAPSULE    Take 1 capsule (500 mg total) by mouth 2 (two) times daily. Prn for mouth ulcer problem   AMOXICILLIN-CLAVULANATE (AUGMENTIN) 875-125 MG PER TABLET    Take 1 tablet by mouth 2 (two) times daily.    BISMUTH SUBSALICYLATE (PEPTO BISMOL) 262 MG/15ML SUSPENSION    Take 15 mLs by mouth as needed.   CETIRIZINE (ZYRTEC) 10 MG TABLET    Take 10 mg by mouth daily as needed.   FLUTICASONE (FLONASE) 50 MCG/ACT NASAL SPRAY    Place into both nostrils daily.   IBUPROFEN (ADVIL,MOTRIN) 200 MG TABLET    OTC as directed    LIDOCAINE (XYLOCAINE) 2 % SOLUTION    SWISH, GARGLE, & SPIT (DO NOT SWALLOW) 15ML'S BY MOUTH 3 TO 4 TIMES A DAY AS NEEDED FOR ULCERS   LOPERAMIDE HCL (IMODIUM PO)    Take by mouth as needed.  Modified Medications   No medications on file  Discontinued Medications   No medications on file

## 2015-04-28 NOTE — Progress Notes (Signed)
Pre visit review using our clinic review tool, if applicable. No additional management support is needed unless otherwise documented below in the visit note. 

## 2015-06-18 ENCOUNTER — Emergency Department (INDEPENDENT_AMBULATORY_CARE_PROVIDER_SITE_OTHER)
Admission: EM | Admit: 2015-06-18 | Discharge: 2015-06-18 | Disposition: A | Discharge status: Home / Self Care | Payer: No Typology Code available for payment source | Source: Home / Self Care | Attending: Family Medicine | Admitting: Family Medicine

## 2015-06-18 ENCOUNTER — Encounter (HOSPITAL_COMMUNITY): Payer: Self-pay | Admitting: *Deleted

## 2015-06-18 DIAGNOSIS — R21 Rash and other nonspecific skin eruption: Secondary | ICD-10-CM

## 2015-06-18 MED ORDER — DOXYCYCLINE HYCLATE 100 MG PO CAPS: 100.0000 mg | ORAL_CAPSULE | Freq: Two times a day (BID) | ORAL | Status: DC

## 2015-06-18 NOTE — ED Provider Notes (Signed)
CSN: [id number]     Arrival date & time 06/18/15  1923 History   First MD Initiated Contact with Patient 06/18/15 1943     Chief Complaint  Patient presents with  . Rash  . Fever   (Consider location/radiation/quality/duration/timing/severity/associated sxs/prior Treatment) Patient is a 59 y.o. female presenting with rash. The history is provided by the patient.  Rash Location:  Leg and torso Torso rash location:  Upper back Leg rash location:  R upper leg Quality: itchiness   Severity:  Mild Onset quality:  Gradual Duration:  2 days Progression:  Spreading Chronicity:  New Context: insect bite/sting   Relieved by:  None tried Worsened by:  Nothing tried Ineffective treatments:  None tried Associated symptoms: nausea   Associated symptoms: no fever and not vomiting     Past Medical History  Diagnosis Date  . Heart murmur     faint  . RMSF [facility] spotted fever)    Past Surgical History  Procedure Laterality Date  . Cesarean section    . Tonsillectomy    . Vaginal hysterectomy  2007    Posterior colporrhaphy for leiomyoma and rectocele   Family History  Problem Relation Age of Onset  . Heart disease Mother 18    bypass and stents   . Hypertension Mother   . Heart disease Maternal Aunt   . Diabetes Maternal Aunt   . Heart disease Maternal Uncle   . Diabetes Maternal Uncle   . Heart disease Maternal Grandmother     pacemaker, stents  . Diabetes Maternal Grandmother   . Heart disease Maternal Grandfather 33  . Diabetes Cousin    History  Substance Use Topics  . Smoking status: Former Smoker    Quit date: 02/27/2006  . Smokeless tobacco: Never Used  . Alcohol Use: Yes     Comment: occasional wine   OB History    Gravida Para Term Preterm AB TAB SAB Ectopic Multiple Living   3 2 2  1     2      Review of Systems  Constitutional: Negative.  Negative for fever.  Respiratory: Negative.   Cardiovascular: Negative.   Gastrointestinal: Positive for  nausea. Negative for vomiting.  Genitourinary: Negative.   Skin: Positive for rash.    Allergies  Codeine  Home Medications   Prior to Admission medications   Medication Sig Start Date End Date Taking? Authorizing Provider  amoxicillin (AMOXIL) 500 MG capsule Take 1 capsule (500 mg total) by mouth 2 (two) times daily. Prn for mouth ulcer problem 05/12/14   Abner Greenspan, MD  amoxicillin-clavulanate (AUGMENTIN) 875-125 MG per tablet Take 1 tablet by mouth 2 (two) times daily.  04/23/15   Historical Provider, MD  bismuth subsalicylate (PEPTO BISMOL) 262 MG/15ML suspension Take 15 mLs by mouth as needed.    Historical Provider, MD  cetirizine (ZYRTEC) 10 MG tablet Take 10 mg by mouth daily as needed.    Historical Provider, MD  doxycycline (VIBRAMYCIN) 100 MG capsule Take 1 capsule (100 mg total) by mouth 2 (two) times daily. 06/18/15   Billy Fischer, MD  fluticasone (FLONASE) 50 MCG/ACT nasal spray Place into both nostrils daily.    Historical Provider, MD  ibuprofen (ADVIL,MOTRIN) 200 MG tablet OTC as directed     Historical Provider, MD  lidocaine (XYLOCAINE) 2 % solution SWISH, GARGLE, & SPIT (DO NOT SWALLOW) 15ML'S BY MOUTH 3 TO 4 TIMES A DAY AS NEEDED FOR ULCERS 09/01/14   Abner Greenspan, MD  Loperamide HCl (IMODIUM PO) Take by mouth as needed.    Historical Provider, MD   BP 122/74 mmHg  Pulse 97  Temp(Src) 98.3 F (36.8 C) (Oral)  Resp 18  SpO2 100% Physical Exam  Constitutional: She is oriented to person, place, and time. She appears well-developed and well-nourished. She appears distressed.  Neck: Normal range of motion. Neck supple.  Abdominal: Soft. Bowel sounds are normal. There is no tenderness.  Lymphadenopathy:    She has no cervical adenopathy.  Neurological: She is alert and oriented to person, place, and time.  Skin: Skin is warm and dry. No rash noted.  No palmar or plantar rash, actually no rash evident at all.  Nursing note and vitals reviewed.   ED Course   Procedures (including critical care time) Labs Review Labs Reviewed - No data to display  Imaging Review No results found.   MDM   1. Rash of back        Billy Fischer, MD 06/18/15 2000

## 2015-06-18 NOTE — Discharge Instructions (Signed)
Use medicine as prescribed, see your doctor if further problems.

## 2015-06-18 NOTE — ED Notes (Signed)
C/O "explosive diarrhea", joint aches, skin sensitivity, achy eyelids, low grade fevers 100-101 degrees, rash to right groin x 2 days.  Has several areas of recent tick bites - all occurring on different days.  States feels like when she had [place] Spotted Fever few years ago.  Has been taking IBU - last dose approx 1 hr ago.

## 2015-06-20 ENCOUNTER — Encounter (HOSPITAL_COMMUNITY): Payer: Self-pay | Admitting: Emergency Medicine

## 2015-06-20 ENCOUNTER — Emergency Department (HOSPITAL_COMMUNITY): Payer: No Typology Code available for payment source

## 2015-06-20 ENCOUNTER — Telehealth: Payer: Self-pay

## 2015-06-20 ENCOUNTER — Inpatient Hospital Stay (HOSPITAL_COMMUNITY)
Admission: EM | Admit: 2015-06-20 | Discharge: 2015-06-23 | DRG: 373 | Disposition: A | Discharge status: Home / Self Care | Payer: No Typology Code available for payment source | Attending: Internal Medicine | Admitting: Internal Medicine

## 2015-06-20 DIAGNOSIS — A020 Salmonella enteritis: Principal | ICD-10-CM | POA: Diagnosis present

## 2015-06-20 DIAGNOSIS — T148 Other injury of unspecified body region: Secondary | ICD-10-CM

## 2015-06-20 DIAGNOSIS — Z90710 Acquired absence of both cervix and uterus: Secondary | ICD-10-CM

## 2015-06-20 DIAGNOSIS — E860 Dehydration: Secondary | ICD-10-CM | POA: Diagnosis present

## 2015-06-20 DIAGNOSIS — K921 Melena: Secondary | ICD-10-CM | POA: Diagnosis not present

## 2015-06-20 DIAGNOSIS — Z8249 Family history of ischemic heart disease and other diseases of the circulatory system: Secondary | ICD-10-CM

## 2015-06-20 DIAGNOSIS — W57XXXA Bitten or stung by nonvenomous insect and other nonvenomous arthropods, initial encounter: Secondary | ICD-10-CM

## 2015-06-20 DIAGNOSIS — R1110 Vomiting, unspecified: Secondary | ICD-10-CM | POA: Diagnosis not present

## 2015-06-20 DIAGNOSIS — R197 Diarrhea, unspecified: Secondary | ICD-10-CM | POA: Diagnosis not present

## 2015-06-20 DIAGNOSIS — Z87891 Personal history of nicotine dependence: Secondary | ICD-10-CM

## 2015-06-20 DIAGNOSIS — R911 Solitary pulmonary nodule: Secondary | ICD-10-CM

## 2015-06-20 DIAGNOSIS — E876 Hypokalemia: Secondary | ICD-10-CM | POA: Diagnosis present

## 2015-06-20 DIAGNOSIS — K121 Other forms of stomatitis: Secondary | ICD-10-CM

## 2015-06-20 DIAGNOSIS — K120 Recurrent oral aphthae: Secondary | ICD-10-CM | POA: Diagnosis not present

## 2015-06-20 DIAGNOSIS — R918 Other nonspecific abnormal finding of lung field: Secondary | ICD-10-CM | POA: Diagnosis present

## 2015-06-20 DIAGNOSIS — R1084 Generalized abdominal pain: Secondary | ICD-10-CM | POA: Diagnosis present

## 2015-06-20 DIAGNOSIS — Z833 Family history of diabetes mellitus: Secondary | ICD-10-CM

## 2015-06-20 LAB — URINE MICROSCOPIC-ADD ON

## 2015-06-20 LAB — CBC WITH DIFFERENTIAL/PLATELET
Basophils Absolute: 0 10*3/uL (ref 0.0–0.1)
Basophils Relative: 0 % (ref 0–1)
EOS PCT: 0 % (ref 0–5)
Eosinophils Absolute: 0 10*3/uL (ref 0.0–0.7)
HCT: 33.8 % — ABNORMAL LOW (ref 36.0–46.0)
HEMOGLOBIN: 11.7 g/dL — AB (ref 12.0–15.0)
Lymphocytes Relative: 15 % (ref 12–46)
Lymphs Abs: 0.5 10*3/uL — ABNORMAL LOW (ref 0.7–4.0)
MCH: 30.2 pg (ref 26.0–34.0)
MCHC: 34.6 g/dL (ref 30.0–36.0)
MCV: 87.3 fL (ref 78.0–100.0)
Monocytes Absolute: 0.4 10*3/uL (ref 0.1–1.0)
Monocytes Relative: 11 % (ref 3–12)
NEUTROS ABS: 2.7 10*3/uL (ref 1.7–7.7)
Neutrophils Relative %: 74 % (ref 43–77)
PLATELETS: 151 10*3/uL (ref 150–400)
RBC: 3.87 MIL/uL (ref 3.87–5.11)
RDW: 12.9 % (ref 11.5–15.5)
WBC: 3.6 10*3/uL — ABNORMAL LOW (ref 4.0–10.5)

## 2015-06-20 LAB — URINALYSIS, ROUTINE W REFLEX MICROSCOPIC
Glucose, UA: NEGATIVE mg/dL
Leukocytes, UA: NEGATIVE
NITRITE: NEGATIVE
Protein, ur: 30 mg/dL — AB
SPECIFIC GRAVITY, URINE: 1.024 (ref 1.005–1.030)
Urobilinogen, UA: 0.2 mg/dL (ref 0.0–1.0)
pH: 5.5 (ref 5.0–8.0)

## 2015-06-20 LAB — COMPREHENSIVE METABOLIC PANEL
ALK PHOS: 51 U/L (ref 38–126)
ALT: 15 U/L (ref 14–54)
AST: 19 U/L (ref 15–41)
Albumin: 3.2 g/dL — ABNORMAL LOW (ref 3.5–5.0)
Anion gap: 11 (ref 5–15)
BILIRUBIN TOTAL: 0.8 mg/dL (ref 0.3–1.2)
BUN: 12 mg/dL (ref 6–20)
CHLORIDE: 106 mmol/L (ref 101–111)
CO2: 20 mmol/L — ABNORMAL LOW (ref 22–32)
Calcium: 8.3 mg/dL — ABNORMAL LOW (ref 8.9–10.3)
Creatinine, Ser: 0.8 mg/dL (ref 0.44–1.00)
GFR calc Af Amer: >60 mL/min (ref >60)
Glucose, Bld: 88 mg/dL (ref 65–99)
Potassium: 3.4 mmol/L — ABNORMAL LOW (ref 3.5–5.1)
Sodium: 137 mmol/L (ref 135–145)
TOTAL PROTEIN: 5.8 g/dL — AB (ref 6.5–8.1)

## 2015-06-20 LAB — LIPASE, BLOOD: Lipase: 17 U/L — ABNORMAL LOW (ref 22–51)

## 2015-06-20 MED ORDER — POTASSIUM CHLORIDE IN NACL 20-0.9 MEQ/L-% IV SOLN
Freq: Once | INTRAVENOUS | Status: AC
  Administered 2015-06-20: 23:00:00 via INTRAVENOUS
  Filled 2015-06-20 (×2): qty 1000

## 2015-06-20 MED ORDER — POTASSIUM CHLORIDE IN NACL 20-0.9 MEQ/L-% IV SOLN
INTRAVENOUS | Status: DC
  Administered 2015-06-21 – 2015-06-22 (×3): via INTRAVENOUS
  Filled 2015-06-20 (×5): qty 1000

## 2015-06-20 MED ORDER — ENOXAPARIN SODIUM 40 MG/0.4ML ~~LOC~~ SOLN
40.0000 mg | Freq: Every day | SUBCUTANEOUS | Status: DC
  Administered 2015-06-20: 40 mg via SUBCUTANEOUS
  Filled 2015-06-20: qty 0.4

## 2015-06-20 MED ORDER — ONDANSETRON HCL 4 MG/2ML IJ SOLN
4.0000 mg | Freq: Four times a day (QID) | INTRAMUSCULAR | Status: DC | PRN
  Administered 2015-06-21: 4 mg via INTRAVENOUS
  Filled 2015-06-20 (×2): qty 2

## 2015-06-20 MED ORDER — DICYCLOMINE HCL 10 MG/ML IM SOLN: 20.0000 mg | Freq: Once | INTRAMUSCULAR | Status: DC

## 2015-06-20 MED ORDER — SODIUM CHLORIDE 0.9 % IV BOLUS (SEPSIS)
1000.0000 mL | Freq: Once | INTRAVENOUS | Status: AC
  Administered 2015-06-20: 1000 mL via INTRAVENOUS

## 2015-06-20 MED ORDER — TRAMADOL HCL 50 MG PO TABS
25.0000 mg | ORAL_TABLET | Freq: Two times a day (BID) | ORAL | Status: DC | PRN
Start: 2015-06-20 — End: 2015-06-23
  Administered 2015-06-21 – 2015-06-23 (×3): 25 mg via ORAL
  Filled 2015-06-20 (×3): qty 1

## 2015-06-20 MED ORDER — ONDANSETRON HCL 4 MG PO TABS: 4.0000 mg | ORAL_TABLET | Freq: Four times a day (QID) | ORAL | Status: DC | PRN

## 2015-06-20 MED ORDER — ONDANSETRON HCL 4 MG/2ML IJ SOLN
4.0000 mg | Freq: Once | INTRAMUSCULAR | Status: AC
  Administered 2015-06-20: 4 mg via INTRAVENOUS
  Filled 2015-06-20: qty 2

## 2015-06-20 MED ORDER — ACETAMINOPHEN 650 MG RE SUPP: 650.0000 mg | Freq: Four times a day (QID) | RECTAL | Status: DC | PRN

## 2015-06-20 MED ORDER — IOHEXOL 300 MG/ML  SOLN
25.0000 mL | Freq: Once | INTRAMUSCULAR | Status: AC | PRN
  Administered 2015-06-20: 25 mL via ORAL

## 2015-06-20 MED ORDER — IOHEXOL 300 MG/ML  SOLN
80.0000 mL | Freq: Once | INTRAMUSCULAR | Status: AC | PRN
  Administered 2015-06-20: 80 mL via INTRAVENOUS

## 2015-06-20 MED ORDER — ACETAMINOPHEN 325 MG PO TABS
650.0000 mg | ORAL_TABLET | Freq: Four times a day (QID) | ORAL | Status: DC | PRN
  Administered 2015-06-21 – 2015-06-23 (×4): 650 mg via ORAL
  Filled 2015-06-20 (×4): qty 2

## 2015-06-20 MED ORDER — LIDOCAINE VISCOUS 2 % MT SOLN
15.0000 mL | OROMUCOSAL | Status: DC | PRN
  Filled 2015-06-20: qty 15

## 2015-06-20 MED ORDER — MORPHINE SULFATE 2 MG/ML IJ SOLN
2.0000 mg | Freq: Once | INTRAMUSCULAR | Status: AC
  Administered 2015-06-20: 2 mg via INTRAVENOUS
  Filled 2015-06-20: qty 1

## 2015-06-20 MED ORDER — DICYCLOMINE HCL 10 MG PO CAPS
10.0000 mg | ORAL_CAPSULE | Freq: Once | ORAL | Status: AC
  Administered 2015-06-20: 10 mg via ORAL
  Filled 2015-06-20: qty 1

## 2015-06-20 NOTE — H&P (Addendum)
[facility] History and Physical  Patient: Brooke Morrow  MRN: [id number]  DOB: [birth year]  DOS: the patient was seen and examined on 06/20/2015 PCP: Loura Pardon, MD  Referring physician: Tori Milks, MD Chief Complaint: Diarrhea  HPI: Brooke Morrow is a 59 y.o. female with Past medical history of aphthous ulcer. The patient is presenting with complaints of diarrhea as well as nausea and vomiting. This is all ongoing since last Thursday. 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 This is responded to doxycycline in the past as well as amoxicillin as well as steroid-induced. Patient had extensive workup including colonoscopy with biopsy as well as has seen a specialist to identify the cause of her diarrhea. She did not have any recent episode until January. She does have significant travel history but denies having any food that is not her routine. She has noticed some tick bite last week and also has noticed roundish rash on her right thigh. She developed diarrhea since then later on started having complaints of abdominal pain which was located all over her abdomen. The pain did not associated with food. Later on since last 2 days she started having complaint of nausea with multiple episodes of vomiting. She denies any blood or black color bowel movements. She did help his diarrhea and abdominal pain and therefore was seen at urgent care and was started on doxycycline as it was thought to be her prior RMSF associated diarrhea. After taking doxycycline the diarrhea actually progressively worsened and therefore she came to the hospital. She did not have any doxycycline today as she was having significant acid reflux as well. She continues to take all of the medication. She is using ibuprofen more than her usual.  The patient is coming from home.  And is independent for most of her  ADL manages her medication on her own.  Review of Systems: as mentioned in the history of present illness.  A comprehensive review of the other systems is negative.  Past Medical History  Diagnosis Date  . Heart murmur     faint  . RMSF [facility] spotted fever)    Past Surgical History  Procedure Laterality Date  . Cesarean section    . Tonsillectomy    . Vaginal hysterectomy  2007    Posterior colporrhaphy for leiomyoma and rectocele   Social History:  reports that she quit smoking about 9 years ago. She has never used smokeless tobacco. She reports that she drinks alcohol. She reports that she does not use illicit drugs.  Allergies  Allergen Reactions  . Codeine     REACTION: could not stay alert    Family History  Problem Relation Age of Onset  . Heart disease Mother 43    bypass and stents   . Hypertension Mother   . Heart disease Maternal Aunt   . Diabetes Maternal Aunt   . Heart disease Maternal Uncle   . Diabetes Maternal Uncle   . Heart disease Maternal Grandmother     pacemaker, stents  . Diabetes Maternal Grandmother   . Heart disease Maternal Grandfather 79  . Diabetes Cousin     Prior to Admission medications   Medication Sig Start Date End Date Taking? Authorizing Provider  amoxicillin (AMOXIL) 500 MG capsule Take 1 capsule (500 mg total) by mouth 2 (two) times daily. Prn for mouth ulcer problem 05/12/14  Yes Abner Greenspan, MD  bismuth  subsalicylate (PEPTO BISMOL) 262 MG/15ML suspension Take 15 mLs by mouth as needed.   Yes Historical Provider, MD  cetirizine (ZYRTEC) 10 MG tablet Take 10 mg by mouth daily as needed for allergies.    Yes Historical Provider, MD  doxycycline (VIBRAMYCIN) 100 MG capsule Take 1 capsule (100 mg total) by mouth 2 (two) times daily. 06/18/15  Yes Billy Fischer, MD  fluticasone (FLONASE) 50 MCG/ACT nasal spray Place 1 spray into both nostrils daily as needed for allergies.    Yes Historical Provider, MD  ibuprofen  (ADVIL,MOTRIN) 200 MG tablet Take 200 mg by mouth every 4 (four) hours as needed for moderate pain. OTC as directed   Yes Historical Provider, MD  lidocaine (XYLOCAINE) 2 % solution SWISH, GARGLE, & SPIT (DO NOT SWALLOW) 15ML'S BY MOUTH 3 TO 4 TIMES A DAY AS NEEDED FOR ULCERS 09/01/14  Yes Abner Greenspan, MD  loperamide (IMODIUM) 2 MG capsule Take 2 mg by mouth daily as needed for diarrhea or loose stools.   Yes Historical Provider, MD    Physical Exam: Filed Vitals:   06/20/15 2111 06/20/15 2130 06/20/15 2210 06/20/15 2223  BP:  110/64  108/63  Pulse: 80 81  80  Temp:   100.3 F (37.9 C) 100.3 F (37.9 C)  TempSrc:   Oral Oral  Resp:    18  Height:      Weight:      SpO2: 99% 98%  100%    General: Alert, Awake and Oriented to Time, Place and Person. Appear in moderate distress Eyes: PERRL ENT: Oral Mucosa a rounded widely is also seen on the lower inner lip. Neck: no JVD Cardiovascular: S1 and S2 Present, no Murmur, Peripheral Pulses Present Respiratory: Bilateral Air entry equal and Decreased,  Clear to Auscultation, no Crackles, no wheezes Abdomen: Bowel Sound present, Soft and mildly diffusely tender Skin: Brownish color non-target-like Rash on the right thigh Extremities: no Pedal edema, no calf tenderness Neurologic: Grossly no focal neuro deficit.  Labs on Admission:  CBC:  Recent Labs Lab 06/20/15 1815  WBC 3.6*  NEUTROABS 2.7  HGB 11.7*  HCT 33.8*  MCV 87.3  PLT 151    CMP     Component Value Date/Time   NA 137 06/20/2015 1815   K 3.4* 06/20/2015 1815   CL 106 06/20/2015 1815   CO2 20* 06/20/2015 1815   GLUCOSE 88 06/20/2015 1815   BUN 12 06/20/2015 1815   CREATININE 0.80 06/20/2015 1815   CREATININE 0.77 10/27/2013 1225   CALCIUM 8.3* 06/20/2015 1815   PROT 5.8* 06/20/2015 1815   ALBUMIN 3.2* 06/20/2015 1815   AST 19 06/20/2015 1815   ALT 15 06/20/2015 1815   ALKPHOS 51 06/20/2015 1815   BILITOT 0.8 06/20/2015 1815   GFRNONAA >60 06/20/2015 1815    GFRAA >60 06/20/2015 1815     Recent Labs Lab 06/20/15 1620  LIPASE 17*    No results for input(s): CKTOTAL, CKMB, CKMBINDEX, TROPONINI in the last 168 hours. BNP (last 3 results) No results for input(s): BNP in the last 8760 hours.  ProBNP (last 3 results) No results for input(s): PROBNP in the last 8760 hours.   Radiological Exams on Admission: Ct Abdomen Pelvis W Contrast  06/20/2015   CLINICAL DATA:  Diarrhea, vomiting since yesterday. Low abdomen pain and low-grade fever.  EXAM: CT ABDOMEN AND PELVIS WITH CONTRAST  TECHNIQUE: Multidetector CT imaging of the abdomen and pelvis was performed using the standard protocol following bolus administration of  intravenous contrast.  CONTRAST:  33mL OMNIPAQUE IOHEXOL 300 MG/ML  SOLN  COMPARISON:  Patient's prior CT scan dated March 08, 2011 is not available for comparison.  FINDINGS: There are liver cysts larger in the left lobe measuring 2.8 x 3.2 cm. The liver is otherwise normal. The pancreas, spleen, adrenal glands are normal. There are simple cysts within the left kidney. The right kidney is normal. There is no hydronephrosis bilaterally. The gallbladder is distended. The aorta is normal. There is no abdominal lymphadenopathy.  There is no small bowel obstruction. There is abnormal diffuse bowel wall thickening of the terminal ileum. There is cecal wall thickening. The appendix is not seen but no inflammation is noted surrounding the cecum. There is diffuse bowel wall thickening, but under distended sigmoid colon.  Fluid-filled bladder is normal. Patient is status post prior hysterectomy. Degenerative joint changes of the spine are identified. No focal lytic or blastic lesion is identified.  Images lung bases demonstrate several right pulmonary nodules with right pulmonary mass medial basal segment right lower lobe pulmonary mass measures 1.8 x 2.2 cm.  IMPRESSION: No bowel obstruction. Bowel wall thickening involving the terminal ileum, cecum,  and sigmoid colon. This is nonspecific but can be seen in colitis, infectious or inflammatory.  Right pulmonary nodule and mass. Neoplasm is not excluded. Recommend further evaluation with chest CT.   Electronically Signed   By: Abelardo Diesel M.D.   On: 06/20/2015 20:36   Assessment/Plan Principal Problem:   Intractable nausea and vomiting Active Problems:   APHTHOUS ULCERS   HEMOCCULT POSITIVE STOOL   Tick bite   Diarrhea   right lower lobe pulmonary mass measures 1.8 x 2.2 cm   1. Intractable nausea and vomiting The patient is presenting with numbness of nausea vomiting with diarrhea. She has had diarrhea before and then started taking doxycycline and which made the diarrhea worse. Possibility of C. difficile cannot be ruled out. But my suspicion for inflammatory colitis is high. We will obtain further workup and avoid putting her on any antibiotics. Celiac titers are already sent out. C. difficile panel is already sent out as well as stool culture. If workup remains positive then we will consult GI.  2. Hemoccult-positive stool. Her stool is positive for blood. We will discontinue Lovenox and put her on SCD. Recheck H&H.  3. Tick bite. Does not appear to be any Elpidio Eric is a) assess this time. I do not find any association between diarrhea and RMSF as well. Continue close monitoring. Avoid antibiotics.  4. Hypokalemia. Currently being replaced. Monitor.  5. Pulmonary mass. Right lower lobe mass measures 1.8 x 2.2 cm. Patient will need a dedicated CT scan, once her acute complaint is improving.  Advance goals of care discussion: Full code   DVT Prophylaxis: mechanical compression device, initially was on Lovenox. Nutrition: Clear liquid diet  Disposition: Admitted as observation initially and later on changed to inpatient, Telemetry unit.  Author: Berle Mull, MD [facility] Pager: [phone] 06/20/2015  If 7PM-7AM, please contact  night-coverage [email or web address] Password [email or web address]

## 2015-06-20 NOTE — ED Provider Notes (Signed)
CSN: [id number]     Arrival date & time 06/20/15  1436 History   None    Chief Complaint  Patient presents with  . Abdominal Pain  . Diarrhea  . Emesis    (Consider location/radiation/quality/duration/timing/severity/associated sxs/prior Treatment) Patient is a 59 y.o. female presenting with diarrhea.  Diarrhea Quality:  Semi-solid and watery Severity:  Severe Onset quality:  Gradual Number of episodes:  20+ daily Timing:  Constant Progression:  Worsening Relieved by:  Nothing Exacerbated by: Abx. Ineffective treatments:  None tried Associated symptoms: abdominal pain, myalgias and vomiting   Associated symptoms: no chills, no recent cough, no diaphoresis, no fever, no headaches and no URI   Risk factors: recent antibiotic use   Risk factors: no sick contacts, no suspicious food intake and no travel to endemic areas     Past Medical History  Diagnosis Date  . Heart murmur     faint  . RMSF [facility] spotted fever)    Past Surgical History  Procedure Laterality Date  . Cesarean section    . Tonsillectomy    . Vaginal hysterectomy  2007    Posterior colporrhaphy for leiomyoma and rectocele   Family History  Problem Relation Age of Onset  . Heart disease Mother 62    bypass and stents   . Hypertension Mother   . Heart disease Maternal Aunt   . Diabetes Maternal Aunt   . Heart disease Maternal Uncle   . Diabetes Maternal Uncle   . Heart disease Maternal Grandmother     pacemaker, stents  . Diabetes Maternal Grandmother   . Heart disease Maternal Grandfather 26  . Diabetes Cousin    History  Substance Use Topics  . Smoking status: Former Smoker    Quit date: 02/27/2006  . Smokeless tobacco: Never Used  . Alcohol Use: Yes     Comment: occasional wine   OB History    Gravida Para Term Preterm AB TAB SAB Ectopic Multiple Living   3 2 2  1     2      Review of Systems  Constitutional: Positive for fatigue. Negative for fever, chills and diaphoresis.   HENT: Positive for mouth sores.   Respiratory: Negative for cough, chest tightness and shortness of breath.   Cardiovascular: Negative for chest pain.  Gastrointestinal: Positive for nausea, vomiting, abdominal pain and diarrhea. Negative for constipation and blood in stool.  Musculoskeletal: Positive for myalgias. Negative for back pain.  Neurological: Negative for weakness, light-headedness and headaches.  Psychiatric/Behavioral: Negative for confusion.  All other systems reviewed and are negative.     Allergies  Codeine  Home Medications   Prior to Admission medications   Medication Sig Start Date End Date Taking? Authorizing Provider  amoxicillin (AMOXIL) 500 MG capsule Take 1 capsule (500 mg total) by mouth 2 (two) times daily. Prn for mouth ulcer problem 05/12/14  Yes Abner Greenspan, MD  bismuth subsalicylate (PEPTO BISMOL) 262 MG/15ML suspension Take 15 mLs by mouth as needed.   Yes Historical Provider, MD  cetirizine (ZYRTEC) 10 MG tablet Take 10 mg by mouth daily as needed for allergies.    Yes Historical Provider, MD  doxycycline (VIBRAMYCIN) 100 MG capsule Take 1 capsule (100 mg total) by mouth 2 (two) times daily. 06/18/15  Yes Billy Fischer, MD  fluticasone (FLONASE) 50 MCG/ACT nasal spray Place 1 spray into both nostrils daily as needed for allergies.    Yes Historical Provider, MD  ibuprofen (ADVIL,MOTRIN) 200 MG tablet Take  200 mg by mouth every 4 (four) hours as needed for moderate pain. OTC as directed   Yes Historical Provider, MD  lidocaine (XYLOCAINE) 2 % solution SWISH, GARGLE, & SPIT (DO NOT SWALLOW) 15ML'S BY MOUTH 3 TO 4 TIMES A DAY AS NEEDED FOR ULCERS 09/01/14  Yes Abner Greenspan, MD  loperamide (IMODIUM) 2 MG capsule Take 2 mg by mouth daily as needed for diarrhea or loose stools.   Yes Historical Provider, MD   BP 109/77 mmHg  Pulse 83  Temp(Src) 98.3 F (36.8 C) (Oral)  Resp 18  Ht 5\' 6"  (1.676 m)  Wt 135 lb 12.8 oz (61.598 kg)  BMI 21.93 kg/m2  SpO2  100% Physical Exam  Constitutional: She is oriented to person, place, and time. She appears well-developed and well-nourished. No distress.  HENT:  Head: Atraumatic.  Nose: Nose normal.  Mouth/Throat: Oropharynx is clear and moist. No oropharyngeal exudate.  1 mm ulcer at internal aspect of lower lip, no surrounding erythema, + tender  Eyes: EOM are normal. Pupils are equal, round, and reactive to light.  Neck: Normal range of motion. Neck supple.  Cardiovascular: Normal rate, regular rhythm, normal heart sounds and intact distal pulses.   No murmur heard. Pulmonary/Chest: Effort normal and breath sounds normal. No respiratory distress. She has no wheezes. She exhibits no tenderness.  Abdominal: Soft. There is tenderness (mild diffuse tenderness). There is no rebound and no guarding.  Musculoskeletal: Normal range of motion. She exhibits no tenderness.  Lymphadenopathy:    She has no cervical adenopathy.  Neurological: She is alert and oriented to person, place, and time. No cranial nerve deficit. Coordination normal.  Skin: Skin is warm and dry. She is not diaphoretic.  Psychiatric: She has a normal mood and affect. Her behavior is normal. Judgment and thought content normal.  Nursing note and vitals reviewed.   ED Course  Procedures (including critical care time) Labs Review Labs Reviewed  LIPASE, BLOOD - Abnormal; Notable for the following:    Lipase 17 (*)    All other components within normal limits  URINALYSIS, ROUTINE W REFLEX MICROSCOPIC (NOT AT [facility]) - Abnormal; Notable for the following:    APPearance CLOUDY (*)    Hgb urine dipstick LARGE (*)    Bilirubin Urine SMALL (*)    Ketones, ur >80 (*)    Protein, ur 30 (*)    All other components within normal limits  CBC WITH DIFFERENTIAL/PLATELET - Abnormal; Notable for the following:    WBC 3.6 (*)    Hemoglobin 11.7 (*)    HCT 33.8 (*)    Lymphs Abs 0.5 (*)    All other components within normal limits  COMPREHENSIVE  METABOLIC PANEL - Abnormal; Notable for the following:    Potassium 3.4 (*)    CO2 20 (*)    Calcium 8.3 (*)    Total Protein 5.8 (*)    Albumin 3.2 (*)    All other components within normal limits  URINE MICROSCOPIC-ADD ON - Abnormal; Notable for the following:    Squamous Epithelial / LPF FEW (*)    Casts GRANULAR CAST (*)    All other components within normal limits  GI PATHOGEN PANEL BY PCR, STOOL  GLIADIN ANTIBODIES, SERUM  TISSUE TRANSGLUTAMINASE, IGA  RETICULIN ANTIBODIES, IGA W TITER    Imaging Review Ct Abdomen Pelvis W Contrast  06/20/2015   CLINICAL DATA:  Diarrhea, vomiting since yesterday. Low abdomen pain and low-grade fever.  EXAM: CT ABDOMEN AND PELVIS  WITH CONTRAST  TECHNIQUE: Multidetector CT imaging of the abdomen and pelvis was performed using the standard protocol following bolus administration of intravenous contrast.  CONTRAST:  82mL OMNIPAQUE IOHEXOL 300 MG/ML  SOLN  COMPARISON:  Patient's prior CT scan dated March 08, 2011 is not available for comparison.  FINDINGS: There are liver cysts larger in the left lobe measuring 2.8 x 3.2 cm. The liver is otherwise normal. The pancreas, spleen, adrenal glands are normal. There are simple cysts within the left kidney. The right kidney is normal. There is no hydronephrosis bilaterally. The gallbladder is distended. The aorta is normal. There is no abdominal lymphadenopathy.  There is no small bowel obstruction. There is abnormal diffuse bowel wall thickening of the terminal ileum. There is cecal wall thickening. The appendix is not seen but no inflammation is noted surrounding the cecum. There is diffuse bowel wall thickening, but under distended sigmoid colon.  Fluid-filled bladder is normal. Patient is status post prior hysterectomy. Degenerative joint changes of the spine are identified. No focal lytic or blastic lesion is identified.  Images lung bases demonstrate several right pulmonary nodules with right pulmonary mass medial  basal segment right lower lobe pulmonary mass measures 1.8 x 2.2 cm.  IMPRESSION: No bowel obstruction. Bowel wall thickening involving the terminal ileum, cecum, and sigmoid colon. This is nonspecific but can be seen in colitis, infectious or inflammatory.  Right pulmonary nodule and mass. Neoplasm is not excluded. Recommend further evaluation with chest CT.   Electronically Signed   By: Abelardo Diesel M.D.   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  She was diagnosed with RMSF 8 years ago and has had chronic diarrhea and mouth ulcers with flairs.  Has had big work up in the past with her PCP and with colonoscopy, etc and reportedly it has all been determined to be associated with her RMSF.  She endorses working outside often and has had several known tick exposures, including this week.   Developed diarrhea 5 days ago, with associated fatigue and arthralgias.  Then developed a rash at her right thigh several day ago and thought it was concerning for acute RMSF.  She was seen at urgent care and started on doxycycline 3 days ago.  Then has had profound increase in her diarrhea since.  She complains of over 20 episodes of loose almost straight watery stool.  Associated abdominal cramping and fatigue.  Developed nausea and has had several episodes of NBNB emesis for the past 2 days.  Also has several painful mouth ulcers.   Husband concerned that this might be 2/2 Hantavirus as they have had mice droppings in an old [employer]ream they are cleaning out.  Also several recent camping trips, but denies drinking well or river water.    Will give NS bolus and send electrolytes to evaluate for dehydration.  Sent her stool for GI panel and c-diff.  Will also check for celiac titers and CT abd/pelvis.     Mild hypoK  Started on IVF with K+   CT abd/pelvis with no intraabdominal pathology to explain her current sx.    She has continued to have very loose diarrhea, over 10x since presenting to the ED.  Still nauseated and  concerned that she will not be able to tolerate PO intake to make up for her water loss from the continued diarrhea.  Admitted for intractable diarrhea and N/V.  Unclear how much (if any) is actually related to RMSF).     If performed, labs, EKGs, and imaging were reviewed and interpreted by myself and my attending, and incorporated in the medical decision making.  Patient was seen with ED Attending, Dr. Shella Spearing, MD   Tori Milks, MD 06/21/15 3358  Charlesetta Shanks, MD 06/24/15 [phone]

## 2015-06-20 NOTE — ED Notes (Signed)
Pharmacy called to inquire about overdue med.

## 2015-06-20 NOTE — Progress Notes (Signed)
  Pt admitted to the unit. Pt is stable, alert and oriented per baseline. Oriented to room, staff, and call bell. Educated to call for any assistance. Bed in lowest position, call bell within reach- will continue to monitor. 

## 2015-06-20 NOTE — Progress Notes (Signed)
Patient and husband educated about c-diff precautions and given handout.

## 2015-06-20 NOTE — Telephone Encounter (Signed)
Please check in on her towards the end of the day to see if she went or how she is doing  Thanks

## 2015-06-20 NOTE — Progress Notes (Signed)
Report given.

## 2015-06-20 NOTE — Telephone Encounter (Signed)
Pt left v/m; pt was seen at [facility] ED on 06/18/15 concerned she might have RMSF;  pt has diarrhea since 06/15/15 and vomiting started 06/19/15; last vomiting was 06/20/15 at 8 AM and in last 24 hours had watery diarrhea 40 times. Pt having some lower left abd pain and low grade fever. Difficult to keep liquids down. Pt had call into team health but has not gotten call back.

## 2015-06-20 NOTE — Progress Notes (Signed)
Called for report. RN asked to call back. Awaiting call

## 2015-06-20 NOTE — Telephone Encounter (Signed)
Spoke with Dr Glori Bickers and with volume of fluid loss recommends to go to ED for evaluation and see if need fluids and possible CT scan. Pt said she will think about it but is not sure she will go back to ED. Pt will call [facility] if needed.

## 2015-06-20 NOTE — Telephone Encounter (Signed)
Left message for patient to return my call.

## 2015-06-20 NOTE — ED Notes (Signed)
Patient states nausea/vomiting/diarrhea.  Patient states she thinks she has "[facility] spotted fever.  I have been bitten by several ticks".   Patient states went [facility] on Saturday.   Patient states she was prescribed doxycycline then, but states it makes her stomach hurt worse.

## 2015-06-21 ENCOUNTER — Inpatient Hospital Stay (HOSPITAL_COMMUNITY): Payer: No Typology Code available for payment source

## 2015-06-21 ENCOUNTER — Encounter (HOSPITAL_COMMUNITY): Payer: Self-pay

## 2015-06-21 DIAGNOSIS — K921 Melena: Secondary | ICD-10-CM | POA: Diagnosis not present

## 2015-06-21 DIAGNOSIS — R1110 Vomiting, unspecified: Secondary | ICD-10-CM | POA: Diagnosis not present

## 2015-06-21 DIAGNOSIS — R918 Other nonspecific abnormal finding of lung field: Secondary | ICD-10-CM | POA: Diagnosis present

## 2015-06-21 DIAGNOSIS — R1084 Generalized abdominal pain: Secondary | ICD-10-CM | POA: Diagnosis present

## 2015-06-21 DIAGNOSIS — R197 Diarrhea, unspecified: Secondary | ICD-10-CM | POA: Diagnosis not present

## 2015-06-21 LAB — COMPREHENSIVE METABOLIC PANEL
ALK PHOS: 51 U/L (ref 38–126)
ALT: 13 U/L — AB (ref 14–54)
AST: 17 U/L (ref 15–41)
Albumin: 2.9 g/dL — ABNORMAL LOW (ref 3.5–5.0)
Anion gap: 9 (ref 5–15)
BUN: 9 mg/dL (ref 6–20)
CO2: 21 mmol/L — AB (ref 22–32)
CREATININE: 0.82 mg/dL (ref 0.44–1.00)
Calcium: 8.1 mg/dL — ABNORMAL LOW (ref 8.9–10.3)
Chloride: 105 mmol/L (ref 101–111)
GFR calc non Af Amer: >60 mL/min (ref >60)
Glucose, Bld: 105 mg/dL — ABNORMAL HIGH (ref 65–99)
Potassium: 3.1 mmol/L — ABNORMAL LOW (ref 3.5–5.1)
Sodium: 135 mmol/L (ref 135–145)
TOTAL PROTEIN: 5.6 g/dL — AB (ref 6.5–8.1)
Total Bilirubin: 1 mg/dL (ref 0.3–1.2)

## 2015-06-21 LAB — CLOSTRIDIUM DIFFICILE BY PCR: CDIFFPCR: NEGATIVE

## 2015-06-21 LAB — CBC
HCT: 33.5 % — ABNORMAL LOW (ref 36.0–46.0)
Hemoglobin: 11.4 g/dL — ABNORMAL LOW (ref 12.0–15.0)
MCH: 29.6 pg (ref 26.0–34.0)
MCHC: 34 g/dL (ref 30.0–36.0)
MCV: 87 fL (ref 78.0–100.0)
Platelets: 159 10*3/uL (ref 150–400)
RBC: 3.85 MIL/uL — ABNORMAL LOW (ref 3.87–5.11)
RDW: 13.1 % (ref 11.5–15.5)
WBC: 4.1 10*3/uL (ref 4.0–10.5)

## 2015-06-21 LAB — SEDIMENTATION RATE: Sed Rate: 30 mm/hr — ABNORMAL HIGH (ref 0–22)

## 2015-06-21 LAB — PROTIME-INR
INR: 1.14 (ref 0.00–1.49)
Prothrombin Time: 14.8 seconds (ref 11.6–15.2)

## 2015-06-21 LAB — GLIADIN ANTIBODIES, SERUM
Gliadin IgA: 2 units (ref 0–19)
Gliadin IgG: 3 units (ref 0–19)

## 2015-06-21 LAB — TISSUE TRANSGLUTAMINASE, IGA: Tissue Transglutaminase Ab, IgA: <2 U/mL (ref 0–3)

## 2015-06-21 LAB — C-REACTIVE PROTEIN: CRP: 15.9 mg/dL — AB (ref <1.0)

## 2015-06-21 LAB — OCCULT BLOOD X 1 CARD TO LAB, STOOL: Fecal Occult Bld: POSITIVE — AB

## 2015-06-21 MED ORDER — IOHEXOL 300 MG/ML  SOLN
75.0000 mL | Freq: Once | INTRAMUSCULAR | Status: AC | PRN
  Administered 2015-06-21: 75 mL via INTRAVENOUS

## 2015-06-21 MED ORDER — MORPHINE SULFATE 2 MG/ML IJ SOLN
2.0000 mg | INTRAMUSCULAR | Status: DC | PRN
  Administered 2015-06-21 – 2015-06-22 (×7): 2 mg via INTRAVENOUS
  Filled 2015-06-21 (×7): qty 1

## 2015-06-21 MED ORDER — ONDANSETRON HCL 4 MG/2ML IJ SOLN
4.0000 mg | INTRAMUSCULAR | Status: DC | PRN
  Administered 2015-06-21 – 2015-06-23 (×11): 4 mg via INTRAVENOUS
  Filled 2015-06-21 (×11): qty 2

## 2015-06-21 MED ORDER — METRONIDAZOLE IN NACL 5-0.79 MG/ML-% IV SOLN
500.0000 mg | Freq: Three times a day (TID) | INTRAVENOUS | Status: DC
  Administered 2015-06-21 – 2015-06-23 (×7): 500 mg via INTRAVENOUS
  Filled 2015-06-21 (×10): qty 100

## 2015-06-21 MED ORDER — POTASSIUM CHLORIDE CRYS ER 20 MEQ PO TBCR
40.0000 meq | EXTENDED_RELEASE_TABLET | Freq: Once | ORAL | Status: AC
  Administered 2015-06-21: 40 meq via ORAL
  Filled 2015-06-21: qty 2

## 2015-06-21 NOTE — Care Management Note (Signed)
Case Management Note  Patient Details  Name: Brooke Morrow MRN: [id number] Date of Birth: 1956/05/06  Subjective/Objective:                 Patient from home with spouse, independent for self care, anticipate discharge to home without home health services. Will continue to follow for discharge needs.   Action/Plan:   Expected Discharge Date:  06/24/15               Expected Discharge Plan:  Home/Self Care  In-House Referral:     Discharge planning Services  CM Consult  Post Acute Care Choice:    Choice offered to:     DME Arranged:    DME Agency:     HH Arranged:    HH Agency:     Status of Service:  In process, will continue to follow  Medicare Important Message Given:    Date Medicare IM Given:    Medicare IM give by:    Date Additional Medicare IM Given:    Additional Medicare Important Message give by:     If discussed at [place] of Stay Meetings, dates discussed:    Additional Comments:  Carles Collet, RN 06/21/2015, 10:47 AM

## 2015-06-21 NOTE — Progress Notes (Addendum)
[facility]                                                                              Patient Demographics  Brooke Morrow, is a 59 y.o. female, DOB - 09/01/1956, KGU:[id number]  Admit date - 06/20/2015   Admitting Physician Lavina Hamman, MD Patient is a 59 year old female with Outpatient Primary MD for the patient is Loura Pardon, MD  LOS - 1   Chief Complaint  Patient presents with  . Abdominal Pain  . Diarrhea  . Emesis       Brief HPI   Patient is a 59 year old female with prior history of [place] spotted fever, aphthous ulcers, chronic diarrhea presented with nausea, vomiting, diarrhea for last 5 days. 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 This is responded to doxycycline in the past as well as amoxicillin. Patient had extensive workup including colonoscopy with biopsy as well as has seen a specialist to identify the cause of her diarrhea. She did not have any recent episode until January. She does have significant travel history but denied having any food that is not her routine. She has noticed some tick bite last week and also has noticed circular rash on her right thigh. Subsequently, she developed diarrhea and abdominal pain. In the last 2 days, patient was complaining of nausea with multiple episodes of vomiting. Patient was seen by urgent care and was started on doxycycline. Patient reported that after 2 days of doxycycline, her diarrhea actually worsened and hence she came to the hospital. Patient is also using ibuprofen more than usual.    Assessment & Plan    Principal Problem:  Intractable nausea and vomiting unclear etiology, likely colitis - GI pathogen panel, C. difficile pending - CT abdomen and pelvis showed colitis, placed on IV Flagyl - GI consulted per admitting physician, Dr. Posey Pronto -FOBT positive likely due to colitis Follow  H&H    tick bite  - Currently patient is stable, hold off on doxycycline for now   Hypokalemia.Replaced    Pulmonary mass, Right lower lobe - CT abdomen and pelvis incidentally also showed right lower lobe nodule 1.8 x 2.2 cm.  - Discussed in detail with the patient and her husband at the bedside, ordered CT chest with IV contrast. Patient denies any smoking history.   Addendum 1:58pm CT chest IMPRESSION: 2.1 cm posterior right lower lobe pulmonary nodule and other sub-cm pulmonary nodules measuring up to 7 mm. Pulmonary metastases cannot be excluded. Consider PET-CT scan for further evaluation. No evidence of lymphadenopathy or pleural effusion.  - consulted pulmonology, d/w Dr Elsworth Soho, will arrange biopsy of the lung nodule or further management.   Code Status: Full code   Family Communication: Discussed in detail with the patient, all imaging results, lab results explained to the patient and husband  Disposition Plan: Not medically ready  Time Spent in minutes   25 minutes  Procedures  CT abdomen and pelvis   Consults   Gastroenterology  DVT Prophylaxis SCD's  Medications  Scheduled Meds: . metronidazole  500 mg  Intravenous 3 times per day   Continuous Infusions: . 0.9 % NaCl with KCl 20 mEq / L 75 mL/hr at 06/20/15 2300   PRN Meds:.acetaminophen **OR** acetaminophen, lidocaine, morphine injection, [DISCONTINUED] ondansetron **OR** ondansetron (ZOFRAN) IV, traMADol   Antibiotics   Anti-infectives    Start     Dose/Rate Route Frequency Ordered Stop   06/21/15 0700  metroNIDAZOLE (FLAGYL) IVPB 500 mg     500 mg 100 mL/hr over 60 Minutes Intravenous 3 times per day 06/21/15 7628          Subjective:   Brooke Morrow was seen and examined today. Feels miserable, nausea but no active vomiting, having multiple bowel movements of watery diarrhea. Low-grade fever    Objective:   Blood pressure 99/66, pulse 88, temperature 100.2 F (37.9 C), temperature  source Oral, resp. rate 16, height 5\' 6"  (1.676 m), weight 62.3 kg (137 lb 5.6 oz), SpO2 96 %.  Wt Readings from Last 3 Encounters:  06/21/15 62.3 kg (137 lb 5.6 oz)  04/28/15 65.205 kg (143 lb 12 oz)  04/04/15 69.174 kg (152 lb 8 oz)     Intake/Output Summary (Last 24 hours) at 06/21/15 1208 Last data filed at 06/21/15 0900  Gross per 24 hour  Intake 2162.5 ml  Output      0 ml  Net 2162.5 ml    Exam  General: Alert and oriented x 3, NAD  HEENT:  PERRLA, EOMI, Anicteric Sclera, mucous membranes moist.   Neck: Supple, no JVD, no masses  CVS: S1 S2 auscultated, no rubs, murmurs or gallops. Regular rate and rhythm.  Respiratory: Clear to auscultation bilaterally, no wheezing, rales or rhonchi  Abdomen: Soft, mild diffuse tenderness , nondistended, + bowel sounds  Ext: no cyanosis clubbing or edema  Neuro: AAOx3, Cr N's II- XII. Strength 5/5 upper and lower extremities bilaterally  Skin: No rashes  Psych: Normal affect and demeanor, alert and oriented x3    Data Review   Micro Results No results found for this or any previous visit (from the past 240 hour(s)).  Radiology Reports Ct Chest W Contrast  06/21/2015   CLINICAL DATA:  Right lower lung nodules seen on recent abdomen CT.  EXAM: CT CHEST WITH CONTRAST  TECHNIQUE: Multidetector CT imaging of the chest was performed during intravenous contrast administration.  CONTRAST:  3mL OMNIPAQUE IOHEXOL 300 MG/ML  SOLN  COMPARISON:  Abdomen CT on 06/20/2015  FINDINGS: Mediastinum/Lymph Nodes: No masses or pathologically enlarged lymph nodes identified.  Lungs/Pleura: 2.1 cm well-circumscribed pulmonary nodule seen in the posterior right lower lobe. Multiple other sub-cm pulmonary nodules are seen in the right lung, mostly in the right lower lobe, which measure up to 7 mm. A single tiny subpleural nodule is seen in the posterior left lower lobe measuring 5 mm.  No evidence of pulmonary infiltrate or pleural effusion.   Musculoskeletal/Soft Tissues: No suspicious bone lesions or other significant chest wall abnormality.  Upper Abdomen:  Hepatic cysts again noted.  IMPRESSION: 2.1 cm posterior right lower lobe pulmonary nodule and other sub-cm pulmonary nodules measuring up to 7 mm. Pulmonary metastases cannot be excluded. Consider PET-CT scan for further evaluation.  No evidence of lymphadenopathy or pleural effusion.   Electronically Signed   By: Earle Gell M.D.   On: 06/21/2015 11:49   Ct Abdomen Pelvis W Contrast  06/20/2015   CLINICAL DATA:  Diarrhea, vomiting since yesterday. Low abdomen pain and low-grade fever.  EXAM: CT ABDOMEN AND PELVIS WITH CONTRAST  TECHNIQUE:  Multidetector CT imaging of the abdomen and pelvis was performed using the standard protocol following bolus administration of intravenous contrast.  CONTRAST:  56mL OMNIPAQUE IOHEXOL 300 MG/ML  SOLN  COMPARISON:  Patient's prior CT scan dated March 08, 2011 is not available for comparison.  FINDINGS: There are liver cysts larger in the left lobe measuring 2.8 x 3.2 cm. The liver is otherwise normal. The pancreas, spleen, adrenal glands are normal. There are simple cysts within the left kidney. The right kidney is normal. There is no hydronephrosis bilaterally. The gallbladder is distended. The aorta is normal. There is no abdominal lymphadenopathy.  There is no small bowel obstruction. There is abnormal diffuse bowel wall thickening of the terminal ileum. There is cecal wall thickening. The appendix is not seen but no inflammation is noted surrounding the cecum. There is diffuse bowel wall thickening, but under distended sigmoid colon.  Fluid-filled bladder is normal. Patient is status post prior hysterectomy. Degenerative joint changes of the spine are identified. No focal lytic or blastic lesion is identified.  Images lung bases demonstrate several right pulmonary nodules with right pulmonary mass medial basal segment right lower lobe pulmonary mass measures  1.8 x 2.2 cm.  IMPRESSION: No bowel obstruction. Bowel wall thickening involving the terminal ileum, cecum, and sigmoid colon. This is nonspecific but can be seen in colitis, infectious or inflammatory.  Right pulmonary nodule and mass. Neoplasm is not excluded. Recommend further evaluation with chest CT.   Electronically Signed   By: Abelardo Diesel M.D.   On: 06/20/2015 20:36    CBC  Recent Labs Lab 06/20/15 1815 06/21/15 0507  WBC 3.6* 4.1  HGB 11.7* 11.4*  HCT 33.8* 33.5*  PLT 151 159  MCV 87.3 87.0  MCH 30.2 29.6  MCHC 34.6 34.0  RDW 12.9 13.1  LYMPHSABS 0.5*  --   MONOABS 0.4  --   EOSABS 0.0  --   BASOSABS 0.0  --     Chemistries   Recent Labs Lab 06/20/15 1815 06/21/15 0507  NA 137 135  K 3.4* 3.1*  CL 106 105  CO2 20* 21*  GLUCOSE 88 105*  BUN 12 9  CREATININE 0.80 0.82  CALCIUM 8.3* 8.1*  AST 19 17  ALT 15 13*  ALKPHOS 51 51  BILITOT 0.8 1.0   ------------------------------------------------------------------------------------------------------------------ estimated creatinine clearance is 71.7 mL/min (by C-G formula based on Cr of 0.82). ------------------------------------------------------------------------------------------------------------------ No results for input(s): HGBA1C in the last 72 hours. ------------------------------------------------------------------------------------------------------------------ No results for input(s): CHOL, HDL, LDLCALC, TRIG, CHOLHDL, LDLDIRECT in the last 72 hours. ------------------------------------------------------------------------------------------------------------------ No results for input(s): TSH, T4TOTAL, T3FREE, THYROIDAB in the last 72 hours.  Invalid input(s): FREET3 ------------------------------------------------------------------------------------------------------------------ No results for input(s): VITAMINB12, FOLATE, FERRITIN, TIBC, IRON, RETICCTPCT in the last 72 hours.  Coagulation  profile  Recent Labs Lab 06/21/15 0507  INR 1.14    No results for input(s): DDIMER in the last 72 hours.  Cardiac Enzymes No results for input(s): CKMB, TROPONINI, MYOGLOBIN in the last 168 hours.  Invalid input(s): CK ------------------------------------------------------------------------------------------------------------------ Invalid input(s): POCBNP  No results for input(s): GLUCAP in the last 72 hours.   Amonie Wisser M.D. [facility] 06/21/2015, 12:08 PM  Pager: [phone] Between 7am to 7pm - call Pager - [phone]  After 7pm go to [email or web address] - password [email or web address]  Call night coverage person covering after 7pm

## 2015-06-21 NOTE — Consult Note (Signed)
Name: Brooke Morrow MRN: [id number] DOB: 1956/08/02    ADMISSION DATE:  06/20/2015 CONSULTATION DATE:  06/21/2015  REFERRING MD :  Georgianne Fick  CHIEF COMPLAINT:  Diarrhea  HISTORY OF PRESENT ILLNESS:  59 year old remote smoker presented with history of diarrhea for 4-5 days. She just returned from a trip to [place]. She reported diffuse abdominal pain, and exposure to tick bite when she takes her horse out to pasture. She has a history of [facility] spotted fever 8 years ago and reports chronic problem since then including diarrhea in 2012 and she had workup including colonoscopy- Amedeo Plenty. She was seen at urgent care and started on doxycycline. She also reports nausea and vomiting at onset. CT abdomen showed bowel thickening of terminal ileum and large bowel and picked up an incidental nodule in the right lower lobe. CT chest was performed-we are asked to comment on the presence of multiple lung nodules. She denies weight loss, fevers   She is on enteric isolation precautions-C. 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  has past surgical history that includes Cesarean section; Tonsillectomy; and Vaginal hysterectomy (2007). Prior to Admission medications   Medication Sig Start Date End Date Taking? Authorizing Provider  amoxicillin (AMOXIL) 500 MG capsule Take 1 capsule (500 mg total) by mouth 2 (two) times daily. Prn for mouth ulcer problem 05/12/14  Yes Abner Greenspan, MD  bismuth  subsalicylate (PEPTO BISMOL) 262 MG/15ML suspension Take 15 mLs by mouth as needed.   Yes Historical Provider, MD  cetirizine (ZYRTEC) 10 MG tablet Take 10 mg by mouth daily as needed for allergies.    Yes Historical Provider, MD  doxycycline (VIBRAMYCIN) 100 MG capsule Take 1 capsule (100 mg total) by mouth 2 (two) times daily. 06/18/15  Yes Billy Fischer, MD  fluticasone (FLONASE) 50 MCG/ACT nasal spray Place 1 spray into both nostrils daily as needed for allergies.    Yes Historical Provider, MD  ibuprofen (ADVIL,MOTRIN) 200 MG tablet Take 200 mg by mouth every 4 (four) hours as needed for moderate pain. OTC as directed   Yes Historical Provider, MD  lidocaine (XYLOCAINE) 2 % solution SWISH, GARGLE, & SPIT (DO NOT SWALLOW) 15ML'S BY MOUTH 3 TO 4 TIMES A DAY AS NEEDED FOR ULCERS 09/01/14  Yes Abner Greenspan, MD  loperamide (IMODIUM) 2 MG capsule Take 2 mg by mouth daily as needed for diarrhea or loose stools.   Yes Historical Provider, MD   Allergies  Allergen Reactions  . Codeine     REACTION: could not stay alert    FAMILY HISTORY:  family history includes Diabetes in her cousin, maternal aunt, maternal grandmother, and maternal uncle; Heart disease in her maternal aunt, maternal grandmother, and maternal uncle; Heart disease (age of onset: 78) in her maternal grandfather; Heart disease (age of onset: 33) in her mother; Hypertension in her mother. SOCIAL HISTORY:  reports that she quit smoking about 9 years ago. She has never used smokeless tobacco. She reports  that she drinks alcohol. She reports that she does not use illicit drugs.  REVIEW OF SYSTEMS:   Constitutional: Negative for fever, chills, weight loss,and diaphoresis.  HENT: Negative for hearing loss, ear pain, nosebleeds, congestion, sore throat, neck pain, tinnitus and ear discharge.   Eyes: Negative for blurred vision, double vision, photophobia, pain, discharge and redness.  Respiratory: Negative for cough, hemoptysis, sputum  production, shortness of breath, wheezing and stridor.   Cardiovascular: Negative for chest pain, palpitations, orthopnea, claudication, leg swelling and PND.  Gastrointestinal: Negative for heartburn, constipation, blood in stool and melena.  Genitourinary: Negative for dysuria, urgency, frequency, hematuria and flank pain.  Musculoskeletal: Negative for myalgias, back pain, joint pain and falls.  Skin: Negative for itching and rash.  Neurological: Negative for dizziness, tingling, tremors, sensory change, speech change, focal weakness, seizures, loss of consciousness, weakness and headaches.  Endo/Heme/Allergies: Negative for environmental allergies and polydipsia. Does not bruise/bleed easily.  SUBJECTIVE:   VITAL SIGNS: Temp:  [100.1 F (37.8 C)-101.4 F (38.6 C)] 101.4 F (38.6 C) (07/19 1400) Pulse Rate:  [78-95] 91 (07/19 1400) Resp:  [14-18] 16 (07/19 1400) BP: (99-116)/(57-93) 100/65 mmHg (07/19 1400) SpO2:  [96 %-100 %] 97 % (07/19 1400) Weight:  [62.3 kg (137 lb 5.6 oz)] 62.3 kg (137 lb 5.6 oz) (07/19 0509)  PHYSICAL EXAMINATION: General:  Acutely ill appearing, in no respiratory distress Neuro:  Alert interactive and non-focal HEENT:  Flat JVD, no pallor or icterus Cardiovascular:  S1 and S2 normal Lungs:  Clear lungs Abdomen:  Soft and nontender Musculoskeletal:  Good pulses, no edema Skin:  Warm   Recent Labs Lab 06/20/15 1815 06/21/15 0507  NA 137 135  K 3.4* 3.1*  CL 106 105  CO2 20* 21*  BUN 12 9  CREATININE 0.80 0.82  GLUCOSE 88 105*    Recent Labs Lab 06/20/15 1815 06/21/15 0507  HGB 11.7* 11.4*  HCT 33.8* 33.5*  WBC 3.6* 4.1  PLT 151 159   Ct Chest W Contrast  06/21/2015   CLINICAL DATA:  Right lower lung nodules seen on recent abdomen CT.  EXAM: CT CHEST WITH CONTRAST  TECHNIQUE: Multidetector CT imaging of the chest was performed during intravenous contrast administration.  CONTRAST:  28mL OMNIPAQUE IOHEXOL 300 MG/ML  SOLN  COMPARISON:   Abdomen CT on 06/20/2015  FINDINGS: Mediastinum/Lymph Nodes: No masses or pathologically enlarged lymph nodes identified.  Lungs/Pleura: 2.1 cm well-circumscribed pulmonary nodule seen in the posterior right lower lobe. Multiple other sub-cm pulmonary nodules are seen in the right lung, mostly in the right lower lobe, which measure up to 7 mm. A single tiny subpleural nodule is seen in the posterior left lower lobe measuring 5 mm.  No evidence of pulmonary infiltrate or pleural effusion.  Musculoskeletal/Soft Tissues: No suspicious bone lesions or other significant chest wall abnormality.  Upper Abdomen:  Hepatic cysts again noted.  IMPRESSION: 2.1 cm posterior right lower lobe pulmonary nodule and other sub-cm pulmonary nodules measuring up to 7 mm. Pulmonary metastases cannot be excluded. Consider PET-CT scan for further evaluation.  No evidence of lymphadenopathy or pleural effusion.   Electronically Signed   By: Earle Gell M.D.   On: 06/21/2015 11:49   Ct Abdomen Pelvis W Contrast  06/20/2015   CLINICAL DATA:  Diarrhea, vomiting since yesterday. Low abdomen pain and low-grade fever.  EXAM: CT ABDOMEN AND PELVIS WITH CONTRAST  TECHNIQUE: Multidetector CT imaging of the abdomen and pelvis was performed using the standard protocol following bolus administration of  intravenous contrast.  CONTRAST:  73mL OMNIPAQUE IOHEXOL 300 MG/ML  SOLN  COMPARISON:  Patient's prior CT scan dated March 08, 2011 is not available for comparison.  FINDINGS: There are liver cysts larger in the left lobe measuring 2.8 x 3.2 cm. The liver is otherwise normal. The pancreas, spleen, adrenal glands are normal. There are simple cysts within the left kidney. The right kidney is normal. There is no hydronephrosis bilaterally. The gallbladder is distended. The aorta is normal. There is no abdominal lymphadenopathy.  There is no small bowel obstruction. There is abnormal diffuse bowel wall thickening of the terminal ileum. There is cecal wall  thickening. The appendix is not seen but no inflammation is noted surrounding the cecum. There is diffuse bowel wall thickening, but under distended sigmoid colon.  Fluid-filled bladder is normal. Patient is status post prior hysterectomy. Degenerative joint changes of the spine are identified. No focal lytic or blastic lesion is identified.  Images lung bases demonstrate several right pulmonary nodules with right pulmonary mass medial basal segment right lower lobe pulmonary mass measures 1.8 x 2.2 cm.  IMPRESSION: No bowel obstruction. Bowel wall thickening involving the terminal ileum, cecum, and sigmoid colon. This is nonspecific but can be seen in colitis, infectious or inflammatory.  Right pulmonary nodule and mass. Neoplasm is not excluded. Recommend further evaluation with chest CT.   Electronically Signed   By: Abelardo Diesel M.D.   On: 06/20/2015 20:36    ASSESSMENT / PLAN:  Multiple pulmonary nodules - etiology is unclear in this never smoker, largest is 2.1 cm posterior right lower lobe The etiology of her diarrhea is unclear-C. difficile and inflammatory bowel disease are being considered. Necrobiotic nodules have been described uncommonly in inflammatory bowel disease.  This could also be metastatic malignancy-if so,  primary is unclear - she had a normal mammogram in 2015, CT abdomen did not reveal an abdominal malignancy   Recommend- Best option here might be to get an outpatient PET CT in 4-6 weeks-be scheduled as an discharge. This may also allow [place] to look for interval change in size of the nodules. If the nodules are persistent in that time.period ,We'll discuss with radiology if the right lower lobe dominant nodule is amenable to CT-guided biopsy  I reviewed images with the patient in detail and discussed the above plan. We will arrange for outpatient follow-up in 4 weeks  Kara Mead MD. [facility]. [facility] Pager [phone] If no response call [phone]    06/21/2015, 4:11 PM

## 2015-06-21 NOTE — Consult Note (Signed)
[facility] Consultation Note  Referring Provider: Dr. Berle Mull [facility]) Primary Care Physician:  Loura Pardon, MD Primary Gastroenterologist:  Dr. Teena Irani  Reason for Consultation:  diarrhea  HPI: Brooke Morrow is a 59 y.o. female admitted with diarrhea.  She has history of intermittent mild-to-moderate diarrhea for the past 4 years, post-dating attack of [facility] Spotted Fever.  Had endoscopy and colonoscopy at that time in Spring 2012, with duodenal and colonic biopsies, which were unrevealing.  In setting of her intermittent but manageable chronic diarrhea, patient was stable until a few days ago.  At that time, she had progressive diarrhea, over the past couple days 40+ watery non-bloody stools per day.  Generalized crampy abdominal pains, can improve after effective defecation.  Associated with nausea and vomiting.  No fevers.  Returned from work trip to [place] shortly before symptoms started.  Recent tick bite and recent course of doxycycline for this.   CT scan upon admission showed some non-specific terminal ileal, cecal, and sigmoid colon thickening, as well as non-specific right-sided lung nodule/lesion.     Past Medical History  Diagnosis Date  . Heart murmur     faint  . RMSF [facility] spotted fever)     Past Surgical History  Procedure Laterality Date  . Cesarean section    . Tonsillectomy    . Vaginal hysterectomy  2007    Posterior colporrhaphy for leiomyoma and rectocele    Prior to Admission medications   Medication Sig Start Date End Date Taking? Authorizing Provider  amoxicillin (AMOXIL) 500 MG capsule Take 1 capsule (500 mg total) by mouth 2 (two) times daily. Prn for mouth ulcer problem 05/12/14  Yes Abner Greenspan, MD  bismuth subsalicylate (PEPTO BISMOL) 262 MG/15ML suspension Take 15 mLs by mouth as needed.   Yes Historical Provider, MD  cetirizine (ZYRTEC) 10 MG tablet Take 10 mg by mouth daily as needed for allergies.    Yes  Historical Provider, MD  doxycycline (VIBRAMYCIN) 100 MG capsule Take 1 capsule (100 mg total) by mouth 2 (two) times daily. 06/18/15  Yes Billy Fischer, MD  fluticasone (FLONASE) 50 MCG/ACT nasal spray Place 1 spray into both nostrils daily as needed for allergies.    Yes Historical Provider, MD  ibuprofen (ADVIL,MOTRIN) 200 MG tablet Take 200 mg by mouth every 4 (four) hours as needed for moderate pain. OTC as directed   Yes Historical Provider, MD  lidocaine (XYLOCAINE) 2 % solution SWISH, GARGLE, & SPIT (DO NOT SWALLOW) 15ML'S BY MOUTH 3 TO 4 TIMES A DAY AS NEEDED FOR ULCERS 09/01/14  Yes Abner Greenspan, MD  loperamide (IMODIUM) 2 MG capsule Take 2 mg by mouth daily as needed for diarrhea or loose stools.   Yes Historical Provider, MD    Current Facility-Administered Medications  Medication Dose Route Frequency Provider Last Rate Last Dose  . 0.9 % NaCl with KCl 20 mEq/ L  infusion   Intravenous Continuous Lavina Hamman, MD 75 mL/hr at 06/20/15 2300    . acetaminophen (TYLENOL) tablet 650 mg  650 mg Oral Q6H PRN Lavina Hamman, MD       Or  . acetaminophen (TYLENOL) suppository 650 mg  650 mg Rectal Q6H PRN Lavina Hamman, MD      . lidocaine (XYLOCAINE) 2 % viscous mouth solution 15 mL  15 mL Mouth/Throat Q3H PRN Lavina Hamman, MD      . metroNIDAZOLE (FLAGYL) IVPB 500 mg  500 mg  Intravenous 3 times per day Ripudeep Krystal Eaton, MD   500 mg at 06/21/15 0646  . morphine 2 MG/ML injection 2 mg  2 mg Intravenous Q3H PRN Ripudeep Krystal Eaton, MD   2 mg at 06/21/15 1124  . ondansetron (ZOFRAN) injection 4 mg  4 mg Intravenous Q4H PRN Ripudeep Krystal Eaton, MD   4 mg at 06/21/15 1012  . traMADol (ULTRAM) tablet 25 mg  25 mg Oral Q12H PRN Lavina Hamman, MD   25 mg at 06/21/15 0335    Allergies as of 06/20/2015 - Review Complete 06/20/2015  Allergen Reaction Noted  . Codeine      Family History  Problem Relation Age of Onset  . Heart disease Mother 18    bypass and stents   . Hypertension Mother   . Heart  disease Maternal Aunt   . Diabetes Maternal Aunt   . Heart disease Maternal Uncle   . Diabetes Maternal Uncle   . Heart disease Maternal Grandmother     pacemaker, stents  . Diabetes Maternal Grandmother   . Heart disease Maternal Grandfather 19  . Diabetes Cousin     History   Social History  . Marital Status: Married    Spouse Name: N/A  . Number of Children: 2  . Years of Education: N/A   Occupational History  . [place] History Main Topics  . Smoking status: Former Smoker    Quit date: 02/27/2006  . Smokeless tobacco: Never Used  . Alcohol Use: Yes     Comment: occasional wine  . Drug Use: No  . Sexual Activity: Not on file     Comment: HYST   Other Topics Concern  . Not on file   Social History Narrative    Review of Systems: Positive = bold Gen: Denies any fever, chills, rigors, night sweats, anorexia, fatigue, weakness, malaise, involuntary weight loss, and sleep disorder CV: Denies chest pain, angina, palpitations, syncope, orthopnea, PND, peripheral edema, and claudication. Resp: Denies dyspnea, cough, sputum, wheezing, coughing up blood. GI: Described in detail in HPI.    GU : Denies urinary burning, blood in urine, urinary frequency, urinary hesitancy, nocturnal urination, and urinary incontinence. MS: Denies joint pain or swelling.  Denies muscle weakness, cramps, atrophy.  Derm: Denies rash, itching, oral ulcerations, hives, unhealing ulcers.  Psych: Denies depression, anxiety, memory loss, suicidal ideation, hallucinations,  and confusion. Heme: Denies bruising, bleeding, and enlarged lymph nodes. Neuro:  Denies any headaches, dizziness, paresthesias. Endo:  Denies any problems with DM, thyroid, adrenal function.  Physical Exam: Vital signs in last 24 hours: Temp:  [98.3 F (36.8 C)-100.3 F (37.9 C)] 100.2 F (37.9 C) (07/19 0510) Pulse Rate:  [78-102] 88 (07/19 0510) Resp:  [14-18] 16 (07/19 0510) BP: (99-116)/(57-93) 99/66 mmHg  (07/19 0510) SpO2:  [96 %-100 %] 96 % (07/19 0510) Weight:  [61.598 kg (135 lb 12.8 oz)-62.3 kg (137 lb 5.6 oz)] 62.3 kg (137 lb 5.6 oz) (07/19 0509) Last BM Date: 06/21/15 General:   Alert, deconditioned, weak-appearing, but is not acutely toxic-appearing Head:  Normocephalic and atraumatic. Eyes:  Sclera clear, no icterus.   Conjunctiva pink. Ears:  Normal auditory acuity. Nose:  No deformity, discharge,  or lesions. Mouth:  No deformity or lesions.  Dry mucous membranes Neck:  Supple; no masses or thyromegaly. Lungs:  Clear throughout to auscultation.   No wheezes, crackles, or rhonchi. No acute distress. Heart:  Regular rate and rhythm; no murmurs, clicks, rubs,  or gallops. Abdomen:  Soft, mild protuberant, mild generalized tenderness without peritonitis. No masses, hepatosplenomegaly or hernias noted. Normal bowel sounds, without guarding, and without rebound.     Msk:  Symmetrical without gross deformities. Normal posture. Pulses:  Normal pulses noted. Extremities:  Without clubbing or edema. Neurologic:  Somnolent but arousable; otherwise, alert and  oriented x4;  Diffusely weak, otherwise grossly normal neurologically. Skin:  Somewhat sallow- and pale-appearing; aphthous ulcerations lower lip, left buccal side of tongue Psych:  Somnolent but arousable, otherwise Alert and cooperative. Normal mood and affect.   Lab Results:  Recent Labs  06/20/15 1815 06/21/15 0507  WBC 3.6* 4.1  HGB 11.7* 11.4*  HCT 33.8* 33.5*  PLT 151 159   BMET  Recent Labs  06/20/15 1815 06/21/15 0507  NA 137 135  K 3.4* 3.1*  CL 106 105  CO2 20* 21*  GLUCOSE 88 105*  BUN 12 9  CREATININE 0.80 0.82  CALCIUM 8.3* 8.1*   LFT  Recent Labs  06/21/15 0507  PROT 5.6*  ALBUMIN 2.9*  AST 17  ALT 13*  ALKPHOS 51  BILITOT 1.0   PT/INR  Recent Labs  06/21/15 0507  LABPROT 14.8  INR 1.14    Studies/Results: Ct Chest W Contrast  06/21/2015   CLINICAL DATA:  Right lower lung  nodules seen on recent abdomen CT.  EXAM: CT CHEST WITH CONTRAST  TECHNIQUE: Multidetector CT imaging of the chest was performed during intravenous contrast administration.  CONTRAST:  38mL OMNIPAQUE IOHEXOL 300 MG/ML  SOLN  COMPARISON:  Abdomen CT on 06/20/2015  FINDINGS: Mediastinum/Lymph Nodes: No masses or pathologically enlarged lymph nodes identified.  Lungs/Pleura: 2.1 cm well-circumscribed pulmonary nodule seen in the posterior right lower lobe. Multiple other sub-cm pulmonary nodules are seen in the right lung, mostly in the right lower lobe, which measure up to 7 mm. A single tiny subpleural nodule is seen in the posterior left lower lobe measuring 5 mm.  No evidence of pulmonary infiltrate or pleural effusion.  Musculoskeletal/Soft Tissues: No suspicious bone lesions or other significant chest wall abnormality.  Upper Abdomen:  Hepatic cysts again noted.  IMPRESSION: 2.1 cm posterior right lower lobe pulmonary nodule and other sub-cm pulmonary nodules measuring up to 7 mm. Pulmonary metastases cannot be excluded. Consider PET-CT scan for further evaluation.  No evidence of lymphadenopathy or pleural effusion.   Electronically Signed   By: Earle Gell M.D.   On: 06/21/2015 11:49   Ct Abdomen Pelvis W Contrast  06/20/2015   CLINICAL DATA:  Diarrhea, vomiting since yesterday. Low abdomen pain and low-grade fever.  EXAM: CT ABDOMEN AND PELVIS WITH CONTRAST  TECHNIQUE: Multidetector CT imaging of the abdomen and pelvis was performed using the standard protocol following bolus administration of intravenous contrast.  CONTRAST:  50mL OMNIPAQUE IOHEXOL 300 MG/ML  SOLN  COMPARISON:  Patient's prior CT scan dated March 08, 2011 is not available for comparison.  FINDINGS: There are liver cysts larger in the left lobe measuring 2.8 x 3.2 cm. The liver is otherwise normal. The pancreas, spleen, adrenal glands are normal. There are simple cysts within the left kidney. The right kidney is normal. There is no  hydronephrosis bilaterally. The gallbladder is distended. The aorta is normal. There is no abdominal lymphadenopathy.  There is no small bowel obstruction. There is abnormal diffuse bowel wall thickening of the terminal ileum. There is cecal wall thickening. The appendix is not seen but no inflammation is noted surrounding the cecum. There is diffuse bowel  wall thickening, but under distended sigmoid colon.  Fluid-filled bladder is normal. Patient is status post prior hysterectomy. Degenerative joint changes of the spine are identified. No focal lytic or blastic lesion is identified.  Images lung bases demonstrate several right pulmonary nodules with right pulmonary mass medial basal segment right lower lobe pulmonary mass measures 1.8 x 2.2 cm.  IMPRESSION: No bowel obstruction. Bowel wall thickening involving the terminal ileum, cecum, and sigmoid colon. This is nonspecific but can be seen in colitis, infectious or inflammatory.  Right pulmonary nodule and mass. Neoplasm is not excluded. Recommend further evaluation with chest CT.   Electronically Signed   By: Abelardo Diesel M.D.   On: 06/20/2015 20:36   Impression:  1.  Nausea, vomiting, diarrhea.  History most consistent with acute infectious process. 2.  Tick bite with history of [place] Spotted Fever.  Patient endorses current history consistent with her prior history of RMSF.  I am not aware of any frequent association of diarrheal symptoms with RMSF. 3.  Abnormal CT scan abdomen (terminal ileal, cecal, sigmoid thickening). 4.  Abnormal imaging chest, right pulmonary nodule/mass.  Plan:  1.  Clear liquid diet, advance slowly as tolerated. 2.  Awaiting C. Diff PCR and awaiting GI pathogen panel with stool cultures. 3.  Don't see clear need for repeat colonoscopy, as she had colonoscopy in 2012 for similar symptoms; however, if symptoms persist and stool studies are unrevealing, might reconsider. 4.  Will follow; thank you for the  consultation.   LOS: 1 day   Rocio Wolak M  06/21/2015, 12:36 PM  Pager [phone] If no answer or after 5 PM call [phone]

## 2015-06-21 NOTE — Progress Notes (Signed)
Utilization Review completed. Rubby Barbary RN BSN CM 

## 2015-06-21 NOTE — Telephone Encounter (Signed)
Per EP[facility] pt did go to ER

## 2015-06-22 DIAGNOSIS — A020 Salmonella enteritis: Secondary | ICD-10-CM | POA: Diagnosis not present

## 2015-06-22 DIAGNOSIS — R197 Diarrhea, unspecified: Secondary | ICD-10-CM | POA: Diagnosis not present

## 2015-06-22 DIAGNOSIS — R918 Other nonspecific abnormal finding of lung field: Secondary | ICD-10-CM

## 2015-06-22 DIAGNOSIS — R1031 Right lower quadrant pain: Secondary | ICD-10-CM

## 2015-06-22 DIAGNOSIS — E876 Hypokalemia: Secondary | ICD-10-CM | POA: Diagnosis not present

## 2015-06-22 DIAGNOSIS — R109 Unspecified abdominal pain: Secondary | ICD-10-CM | POA: Diagnosis not present

## 2015-06-22 LAB — CBC
HCT: 30.6 % — ABNORMAL LOW (ref 36.0–46.0)
HEMOGLOBIN: 10.5 g/dL — AB (ref 12.0–15.0)
MCH: 29.3 pg (ref 26.0–34.0)
MCHC: 34.3 g/dL (ref 30.0–36.0)
MCV: 85.5 fL (ref 78.0–100.0)
Platelets: 170 10*3/uL (ref 150–400)
RBC: 3.58 MIL/uL — ABNORMAL LOW (ref 3.87–5.11)
RDW: 13 % (ref 11.5–15.5)
WBC: 4.6 10*3/uL (ref 4.0–10.5)

## 2015-06-22 LAB — BASIC METABOLIC PANEL
Anion gap: 9 (ref 5–15)
BUN: 5 mg/dL — ABNORMAL LOW (ref 6–20)
CALCIUM: 8.1 mg/dL — AB (ref 8.9–10.3)
CHLORIDE: 106 mmol/L (ref 101–111)
CO2: 21 mmol/L — ABNORMAL LOW (ref 22–32)
CREATININE: 0.61 mg/dL (ref 0.44–1.00)
Glucose, Bld: 107 mg/dL — ABNORMAL HIGH (ref 65–99)
Potassium: 2.9 mmol/L — ABNORMAL LOW (ref 3.5–5.1)
SODIUM: 136 mmol/L (ref 135–145)

## 2015-06-22 LAB — GI PATHOGEN PANEL BY PCR, STOOL
C difficile toxin A/B: NOT DETECTED
CRYPTOSPORIDIUM BY PCR: NOT DETECTED
Campylobacter by PCR: NOT DETECTED
E COLI (ETEC) LT/ST: NOT DETECTED
E coli (STEC): NOT DETECTED
E coli 0157 by PCR: NOT DETECTED
G LAMBLIA BY PCR: NOT DETECTED
Norovirus GI/GII: NOT DETECTED
Rotavirus A by PCR: NOT DETECTED
SHIGELLA BY PCR: NOT DETECTED

## 2015-06-22 LAB — RETICULIN ANTIBODIES, IGA W TITER: Reticulin Ab, IgA: NEGATIVE titer (ref <2.5)

## 2015-06-22 MED ORDER — LOPERAMIDE HCL 2 MG PO CAPS
2.0000 mg | ORAL_CAPSULE | ORAL | Status: DC | PRN
  Administered 2015-06-22 – 2015-06-23 (×5): 2 mg via ORAL
  Filled 2015-06-22 (×5): qty 1

## 2015-06-22 MED ORDER — POTASSIUM CHLORIDE CRYS ER 20 MEQ PO TBCR
40.0000 meq | EXTENDED_RELEASE_TABLET | Freq: Once | ORAL | Status: AC
  Administered 2015-06-22: 40 meq via ORAL
  Filled 2015-06-22: qty 2

## 2015-06-22 NOTE — Progress Notes (Signed)
[facility] PROGRESS NOTE  Brooke Morrow HDQ:[id number] DOB: 1956-10-06 DOA: 06/20/2015 PCP: Loura Pardon, MD   Brief narrative 59 year old female with prior history of RMSF, aphthous ulcers, chronic diarrhea off-and-on for past 4 years presented with nausea, vomiting and diarrhea for past 5 days. She had extensive workup including colonoscopy with biopsy which was unremarkable. She has monitoring her diet also has extensive travel history. She noticed circular fashion the right eye after she had a tick bite 1 week back. She then subsequently developed diarrhea with abdominal pain. The past 2 days prior to admission she has nausea with multiple episodes of vomiting and diarrhea. She was seen in the urgent care on started on doxycycline which however worsened her symptoms and came to the hospital.  Assessment/Plan: Intractable nausea and vomiting with persistent diarrhea Possibly related to acute colitis Infective versus inflammatory). Stool for C. difficile negative. GI pathogen panel and stool cultures pending. Continues care with IV hydration and antiemetics. Diarrhea persists. (Nausea and vomiting have currently resolved). CT abdomen pelvis on admission showing colitis and patient placed on empiric IV Flagyl. Diet advance to full liquid. Appreciate GI recommendations. Plan on conservative management for now and follow cultures. Recommend to hold off antimotility agents for now. FOBT positive which is likely due to colitis. H&H stable.  recent tick bite Right upper thigh rash appears resolved. Doxycycline discontinued. Unlikely to cause diarrhea.  Right lower lobe pulmonary mass Incidental finding on CT abdomen and pelvis.CT chest with IV contrast done which showed a 2.1 cm posterior right lower lobe pulmonary nodule with other subcentimeter pulmonary nodules measuring up to 7 mm. Seen bipolar consult and recommend PET scan in 4-6 weeks and they will follow-up as outpatient patient  in about 4 weeks. Recommend that if the nodules are persistent during PET scan will discuss with radiology for CT-guided biopsy.  Hypokalemia Secondary to diarrhea. Replenish   DVT prophylaxis: Subcutaneous Lovenox Diet: Full liquid  Code Status: Full code Family Communication: husband at bedside Disposition Plan: home once symptoms resolved   Consultants:  [facility]  Procedures:  Ct chest abdomen and pelvis    Antibiotics:  IV Flagyl  HPI/Subjective: Patient seen and examined. She has persistent diarrhea although improved since admission. Reports LLQ  abdominal pain. No further nausea or vomiting.  Objective: Filed Vitals:   06/22/15 0644  BP: 93/57  Pulse: 77  Temp: 98.7 F (37.1 C)  Resp: 18    Intake/Output Summary (Last 24 hours) at 06/22/15 1346 Last data filed at 06/22/15 0900  Gross per 24 hour  Intake   1240 ml  Output    200 ml  Net   1040 ml   Filed Weights   06/20/15 1440 06/21/15 0509 06/22/15 0700  Weight: 61.598 kg (135 lb 12.8 oz) 62.3 kg (137 lb 5.6 oz) 63.2 kg (139 lb 5.3 oz)    Exam:   General: Middle aged thin built female in NAD  HEENT: No pallor, moist oral mucosa  Chest: Clear to auscultation bilaterally  CVS: Normal S1 and S2, no murmurs  GI: Soft, right lower quadrant tenderness, nondistended, bowel sounds present  Musculoskeletal: Warm, no edema, no rash over right upper thigh  CNS: Alert and oriented  Data Reviewed: Basic Metabolic Panel:  Recent Labs Lab 06/20/15 1815 06/21/15 0507 06/22/15 0407  NA 137 135 136  K 3.4* 3.1* 2.9*  CL 106 105 106  CO2 20* 21* 21*  GLUCOSE 88 105* 107*  BUN 12 9 5*  CREATININE 0.80  0.82 0.61  CALCIUM 8.3* 8.1* 8.1*   Liver Function Tests:  Recent Labs Lab 06/20/15 1815 06/21/15 0507  AST 19 17  ALT 15 13*  ALKPHOS 51 51  BILITOT 0.8 1.0  PROT 5.8* 5.6*  ALBUMIN 3.2* 2.9*    Recent Labs Lab 06/20/15 1620  LIPASE 17*   No results for input(s): AMMONIA in  the last 168 hours. CBC:  Recent Labs Lab 06/20/15 1815 06/21/15 0507 06/22/15 0407  WBC 3.6* 4.1 4.6  NEUTROABS 2.7  --   --   HGB 11.7* 11.4* 10.5*  HCT 33.8* 33.5* 30.6*  MCV 87.3 87.0 85.5  PLT 151 159 170   Cardiac Enzymes: No results for input(s): CKTOTAL, CKMB, CKMBINDEX, TROPONINI in the last 168 hours. BNP (last 3 results) No results for input(s): BNP in the last 8760 hours.  ProBNP (last 3 results) No results for input(s): PROBNP in the last 8760 hours.  CBG: No results for input(s): GLUCAP in the last 168 hours.  Recent Results (from the past 240 hour(s))  Stool culture     Status: None (Preliminary result)   Collection Time: 06/21/15  1:23 AM  Result Value Ref Range Status   Specimen Description STOOL  Final   Special Requests NONE  Final   Culture   Final    Culture reincubated for better growth Performed at [facility]    Report Status PENDING  Incomplete  Clostridium Difficile by PCR (not at [facility])     Status: None   Collection Time: 06/21/15  2:31 PM  Result Value Ref Range Status   C difficile by pcr NEGATIVE NEGATIVE Final     Studies: Ct Chest W Contrast  06/21/2015   CLINICAL DATA:  Right lower lung nodules seen on recent abdomen CT.  EXAM: CT CHEST WITH CONTRAST  TECHNIQUE: Multidetector CT imaging of the chest was performed during intravenous contrast administration.  CONTRAST:  57mL OMNIPAQUE IOHEXOL 300 MG/ML  SOLN  COMPARISON:  Abdomen CT on 06/20/2015  FINDINGS: Mediastinum/Lymph Nodes: No masses or pathologically enlarged lymph nodes identified.  Lungs/Pleura: 2.1 cm well-circumscribed pulmonary nodule seen in the posterior right lower lobe. Multiple other sub-cm pulmonary nodules are seen in the right lung, mostly in the right lower lobe, which measure up to 7 mm. A single tiny subpleural nodule is seen in the posterior left lower lobe measuring 5 mm.  No evidence of pulmonary infiltrate or pleural effusion.  Musculoskeletal/Soft  Tissues: No suspicious bone lesions or other significant chest wall abnormality.  Upper Abdomen:  Hepatic cysts again noted.  IMPRESSION: 2.1 cm posterior right lower lobe pulmonary nodule and other sub-cm pulmonary nodules measuring up to 7 mm. Pulmonary metastases cannot be excluded. Consider PET-CT scan for further evaluation.  No evidence of lymphadenopathy or pleural effusion.   Electronically Signed   By: Earle Gell M.D.   On: 06/21/2015 11:49   Ct Abdomen Pelvis W Contrast  06/20/2015   CLINICAL DATA:  Diarrhea, vomiting since yesterday. Low abdomen pain and low-grade fever.  EXAM: CT ABDOMEN AND PELVIS WITH CONTRAST  TECHNIQUE: Multidetector CT imaging of the abdomen and pelvis was performed using the standard protocol following bolus administration of intravenous contrast.  CONTRAST:  66mL OMNIPAQUE IOHEXOL 300 MG/ML  SOLN  COMPARISON:  Patient's prior CT scan dated March 08, 2011 is not available for comparison.  FINDINGS: There are liver cysts larger in the left lobe measuring 2.8 x 3.2 cm. The liver is otherwise normal. The pancreas, spleen, adrenal  glands are normal. There are simple cysts within the left kidney. The right kidney is normal. There is no hydronephrosis bilaterally. The gallbladder is distended. The aorta is normal. There is no abdominal lymphadenopathy.  There is no small bowel obstruction. There is abnormal diffuse bowel wall thickening of the terminal ileum. There is cecal wall thickening. The appendix is not seen but no inflammation is noted surrounding the cecum. There is diffuse bowel wall thickening, but under distended sigmoid colon.  Fluid-filled bladder is normal. Patient is status post prior hysterectomy. Degenerative joint changes of the spine are identified. No focal lytic or blastic lesion is identified.  Images lung bases demonstrate several right pulmonary nodules with right pulmonary mass medial basal segment right lower lobe pulmonary mass measures 1.8 x 2.2 cm.   IMPRESSION: No bowel obstruction. Bowel wall thickening involving the terminal ileum, cecum, and sigmoid colon. This is nonspecific but can be seen in colitis, infectious or inflammatory.  Right pulmonary nodule and mass. Neoplasm is not excluded. Recommend further evaluation with chest CT.   Electronically Signed   By: Abelardo Diesel M.D.   On: 06/20/2015 20:36    Scheduled Meds: . metronidazole  500 mg Intravenous 3 times per day   Continuous Infusions: . 0.9 % NaCl with KCl 20 mEq / L 75 mL/hr at 06/22/15 0527     Time spent: 25 minutes    Louellen Molder  [facility] Pager [phone] If 7PM-7AM, please contact night-coverage at [email or web address], password [facility] 06/22/2015, 1:46 PM  LOS: 2 days

## 2015-06-22 NOTE — Progress Notes (Signed)
Subjective: Diarrhea improving but still 20+ daily (cf 40+ daily). Some crampy abdominal pain, but improving.  Objective: Vital signs in last 24 hours: Temp:  [98.7 F (37.1 C)-101.4 F (38.6 C)] 98.7 F (37.1 C) (07/20 0644) Pulse Rate:  [76-91] 77 (07/20 0644) Resp:  [16-18] 18 (07/20 0644) BP: (93-100)/(57-65) 93/57 mmHg (07/20 0644) SpO2:  [96 %-97 %] 96 % (07/20 0644) Weight:  [63.2 kg (139 lb 5.3 oz)] 63.2 kg (139 lb 5.3 oz) (07/20 0700) Weight change: 1.601 kg (3 lb 8.5 oz) Last BM Date: 06/21/15  PE: GEN:  Deconditioned and dehydrated-appearing ABD:  Soft, mild generalized tenderness  Lab Results: CBC    Component Value Date/Time   WBC 4.6 06/22/2015 0407   RBC 3.58* 06/22/2015 0407   HGB 10.5* 06/22/2015 0407   HCT 30.6* 06/22/2015 0407   PLT 170 06/22/2015 0407   MCV 85.5 06/22/2015 0407   MCH 29.3 06/22/2015 0407   MCHC 34.3 06/22/2015 0407   RDW 13.0 06/22/2015 0407   LYMPHSABS 0.5* 06/20/2015 1815   MONOABS 0.4 06/20/2015 1815   EOSABS 0.0 06/20/2015 1815   BASOSABS 0.0 06/20/2015 1815   CMP     Component Value Date/Time   NA 136 06/22/2015 0407   K 2.9* 06/22/2015 0407   CL 106 06/22/2015 0407   CO2 21* 06/22/2015 0407   GLUCOSE 107* 06/22/2015 0407   BUN 5* 06/22/2015 0407   CREATININE 0.61 06/22/2015 0407   CREATININE 0.77 10/27/2013 1225   CALCIUM 8.1* 06/22/2015 0407   PROT 5.6* 06/21/2015 0507   ALBUMIN 2.9* 06/21/2015 0507   AST 17 06/21/2015 0507   ALT 13* 06/21/2015 0507   ALKPHOS 51 06/21/2015 0507   BILITOT 1.0 06/21/2015 0507   GFRNONAA >60 06/22/2015 0407   GFRAA >60 06/22/2015 0407   Assessment:  1.  Acute diarrhea, suspect infectious, some improvement. 2.  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 3.  Hypokalemia, from diarrhea. 4.  Dehydration.  Plan:  1.  C. Diff is negative; awaiting other stool studies. 2.  Aggressive potassium repletion. 3.  Continue hydration and supportive  care (antiemetics). 4.  Would hold off on antimotility agents (e.g., loperamide) until we have more definitively ruled out more acute infectious pathogen. 5.  Ambulate, OOBTC, mobilize. 6.  Advance diet to full liquids. 7.  Will follow.   Landry Dyke 06/22/2015, 9:25 AM   Pager [phone] If no answer or after 5 PM call [phone]

## 2015-06-23 ENCOUNTER — Telehealth: Payer: Self-pay | Admitting: *Deleted

## 2015-06-23 DIAGNOSIS — A020 Salmonella enteritis: Secondary | ICD-10-CM | POA: Diagnosis not present

## 2015-06-23 DIAGNOSIS — R109 Unspecified abdominal pain: Secondary | ICD-10-CM | POA: Diagnosis not present

## 2015-06-23 DIAGNOSIS — E876 Hypokalemia: Secondary | ICD-10-CM | POA: Diagnosis not present

## 2015-06-23 DIAGNOSIS — R918 Other nonspecific abnormal finding of lung field: Secondary | ICD-10-CM | POA: Diagnosis not present

## 2015-06-23 DIAGNOSIS — R911 Solitary pulmonary nodule: Secondary | ICD-10-CM

## 2015-06-23 LAB — CBC
HEMATOCRIT: 29.7 % — AB (ref 36.0–46.0)
HEMOGLOBIN: 10.1 g/dL — AB (ref 12.0–15.0)
MCH: 29.4 pg (ref 26.0–34.0)
MCHC: 34 g/dL (ref 30.0–36.0)
MCV: 86.6 fL (ref 78.0–100.0)
PLATELETS: 166 10*3/uL (ref 150–400)
RBC: 3.43 MIL/uL — ABNORMAL LOW (ref 3.87–5.11)
RDW: 13.5 % (ref 11.5–15.5)
WBC: 5 10*3/uL (ref 4.0–10.5)

## 2015-06-23 LAB — BASIC METABOLIC PANEL
ANION GAP: 8 (ref 5–15)
BUN: 5 mg/dL — AB (ref 6–20)
CO2: 21 mmol/L — AB (ref 22–32)
CREATININE: 0.7 mg/dL (ref 0.44–1.00)
Calcium: 8.2 mg/dL — ABNORMAL LOW (ref 8.9–10.3)
Chloride: 111 mmol/L (ref 101–111)
Glucose, Bld: 88 mg/dL (ref 65–99)
POTASSIUM: 3.7 mmol/L (ref 3.5–5.1)
Sodium: 140 mmol/L (ref 135–145)

## 2015-06-23 MED ORDER — CIPROFLOXACIN HCL 500 MG PO TABS: 500.0000 mg | ORAL_TABLET | Freq: Two times a day (BID) | ORAL | Status: AC

## 2015-06-23 MED ORDER — CIPROFLOXACIN HCL 500 MG PO TABS
500.0000 mg | ORAL_TABLET | Freq: Two times a day (BID) | ORAL | Status: DC
  Administered 2015-06-23: 500 mg via ORAL
  Filled 2015-06-23: qty 1

## 2015-06-23 MED ORDER — TRAMADOL HCL 50 MG PO TABS: 25.0000 mg | ORAL_TABLET | Freq: Two times a day (BID) | ORAL | Status: DC | PRN

## 2015-06-23 NOTE — Progress Notes (Addendum)
Dr. Clementeen Graham made aware of CDIFF and GI Panel results negative.   7:56 AM Verbal order for Dr. Clementeen Graham to d/c enteric precautions.

## 2015-06-23 NOTE — Telephone Encounter (Signed)
-----   Message from Rigoberto Noel, MD sent at 06/23/2015 11:22 AM EDT ----- Pl order a PET scan for her TOMORROW

## 2015-06-23 NOTE — Discharge Summary (Signed)
Physician Discharge Summary  Brooke Morrow IRJ:[id number] DOB: 1955-12-20 DOA: 06/20/2015  PCP: Loura Pardon, MD  Admit date: 06/20/2015 Discharge date: 06/23/2015  Time spent: 35 minutes  Recommendations for Outpatient Follow-up:  1. Charge home with outpatient follow-up with PCP and pulmonary 2. Neurologist will arrange for outpatient PET scan to evaluate her right lung nodule further. 3. Patient was completed 7 day course of oral ciprofloxacin on 06/29/2015  Discharge Diagnoses:  Principal Problem:   Salmonella enteritis   Active Problems:  ?Tick bite   Diarrhea   Intractable nausea and vomiting   right lower lobe pulmonary mass measures 1.8 x 2.2 cm   Abdominal pain, diffuse   Hypokalemia    Discharge Condition: Fair  Diet recommendation: Regular  Filed Weights   06/21/15 0509 06/22/15 0700 06/23/15 0541  Weight: 62.3 kg (137 lb 5.6 oz) 63.2 kg (139 lb 5.3 oz) 65 kg (143 lb 4.8 oz)    History of present illness:  Please refer to admission H&P for details, in brief, 59 year old female with prior history of RMSF, aphthous ulcers, chronic diarrhea off-and-on for past 4 years presented with nausea, vomiting and diarrhea for past 5 days. She had extensive workup including colonoscopy with biopsy which was unremarkable. She has monitoring her diet also has extensive travel history. She noticed circular fashion the right eye after she had a tick bite 1 week back. She then subsequently developed diarrhea with abdominal pain. The past 2 days prior to admission she has nausea with multiple episodes of vomiting and diarrhea. She was seen in the urgent care on started on doxycycline which however worsened her symptoms and came to the hospital.  Hospital Course:  Intractable nausea and vomiting with persistent diarrhea Secondary to acute Salmonella enteritis as seen on GI pathogen panel.. Patient recently returned from [place] and had chicken sandwiches with salad. Stool for  C. difficile and stool culture negative.  -Nausea vomiting has resolved. Abdominal discomfort markedly improved. Diarrhea progressively improving. -We'll plan on treating her with a seven-day course of oral ciprofloxacin and have a followed up with her PCP as outpatient. -Appreciate GI evaluation and recommendations. She may follow-up with [facility] as outpatient.  recent tick bite Right upper thigh rash appears resolved. Doxycycline discontinued. Unlikely to cause diarrhea.  Right lower lobe pulmonary mass Incidental finding on CT abdomen and pelvis.CT chest with IV contrast done which showed a 2.1 cm posterior right lower lobe pulmonary nodule with other subcentimeter pulmonary nodules measuring up to 7 mm. Seen by pulmonary consult and recommend PET scan in 4-6 weeks and they will follow-up as outpatient patient in about 4 weeks. They will Arrange for PET scan as outpatient.. Recommend that if the nodules are persistent during PET scan will discuss with radiology for CT-guided biopsy.  Hypokalemia Secondary to diarrhea. Replenished   Diet: Regular  Code Status: Full code Family Communication: husband at bedside Disposition Plan: home   Consultants:  [facility]  Procedures:  Ct chest abdomen and pelvis    Antibiotics:  IV Flagyl 7/18-7/21  Ciprofloxacin 7/21-7/27  Discharge Exam: Filed Vitals:   06/23/15 0541  BP: 93/55  Pulse: 68  Temp: 98.6 F (37 C)  Resp: 18    General: Middle aged thin built female in NAD  HEENT: No pallor, moist oral mucosa  Chest: Clear to auscultation bilaterally  CVS: Normal S1 and S2, no murmurs  GI: Soft, NT, nondistended, bowel sounds present  Musculoskeletal: Warm, no edema, no rash over right upper thigh  CNS: Alert and oriented   Discharge Instructions    Current Discharge Medication List    START taking these medications   Details  ciprofloxacin (CIPRO) 500 MG tablet Take 1 tablet (500 mg total) by mouth 2 (two)  times daily. Qty: 14 tablet, Refills: 0    traMADol (ULTRAM) 50 MG tablet Take 0.5 tablets (25 mg total) by mouth every 12 (twelve) hours as needed for severe pain. Qty: 30 tablet, Refills: 0      CONTINUE these medications which have NOT CHANGED   Details  bismuth subsalicylate (PEPTO BISMOL) 262 MG/15ML suspension Take 15 mLs by mouth as needed.    cetirizine (ZYRTEC) 10 MG tablet Take 10 mg by mouth daily as needed for allergies.     fluticasone (FLONASE) 50 MCG/ACT nasal spray Place 1 spray into both nostrils daily as needed for allergies.     ibuprofen (ADVIL,MOTRIN) 200 MG tablet Take 200 mg by mouth every 4 (four) hours as needed for moderate pain. OTC as directed    lidocaine (XYLOCAINE) 2 % solution SWISH, GARGLE, & SPIT (DO NOT SWALLOW) 15ML'S BY MOUTH 3 TO 4 TIMES A DAY AS NEEDED FOR ULCERS Qty: 400 mL, Refills: 2    loperamide (IMODIUM) 2 MG capsule Take 2 mg by mouth daily as needed for diarrhea or loose stools.      STOP taking these medications     amoxicillin (AMOXIL) 500 MG capsule      doxycycline (VIBRAMYCIN) 100 MG capsule        Allergies  Allergen Reactions  . Codeine     REACTION: could not stay alert   Follow-up Information    Follow up with Rigoberto Noel., MD On 07/27/2015.   Specialty:  Pulmonary Disease   Why:  1-30 pm   Contact information:   [address] [address] [phone]       Follow up with Loura Pardon, MD. Schedule an appointment as soon as possible for a visit in 1 week.   Specialties:  Family Medicine, Radiology   Contact information:   [address] [place] [address] [phone]       Follow up with Teena Irani, MD. Schedule an appointment as soon as possible for a visit in 4 weeks.   Specialty:  Gastroenterology   Contact information:   [address] [place] [address] [phone]        The results of significant diagnostics from this  hospitalization (including imaging, microbiology, ancillary and laboratory) are listed below for reference.    Significant Diagnostic Studies: Ct Chest W Contrast  06/21/2015   CLINICAL DATA:  Right lower lung nodules seen on recent abdomen CT.  EXAM: CT CHEST WITH CONTRAST  TECHNIQUE: Multidetector CT imaging of the chest was performed during intravenous contrast administration.  CONTRAST:  22mL OMNIPAQUE IOHEXOL 300 MG/ML  SOLN  COMPARISON:  Abdomen CT on 06/20/2015  FINDINGS: Mediastinum/Lymph Nodes: No masses or pathologically enlarged lymph nodes identified.  Lungs/Pleura: 2.1 cm well-circumscribed pulmonary nodule seen in the posterior right lower lobe. Multiple other sub-cm pulmonary nodules are seen in the right lung, mostly in the right lower lobe, which measure up to 7 mm. A single tiny subpleural nodule is seen in the posterior left lower lobe measuring 5 mm.  No evidence of pulmonary infiltrate or pleural effusion.  Musculoskeletal/Soft Tissues: No suspicious bone lesions or other significant chest wall abnormality.  Upper Abdomen:  Hepatic cysts  again noted.  IMPRESSION: 2.1 cm posterior right lower lobe pulmonary nodule and other sub-cm pulmonary nodules measuring up to 7 mm. Pulmonary metastases cannot be excluded. Consider PET-CT scan for further evaluation.  No evidence of lymphadenopathy or pleural effusion.   Electronically Signed   By: Earle Gell M.D.   On: 06/21/2015 11:49   Ct Abdomen Pelvis W Contrast  06/20/2015   CLINICAL DATA:  Diarrhea, vomiting since yesterday. Low abdomen pain and low-grade fever.  EXAM: CT ABDOMEN AND PELVIS WITH CONTRAST  TECHNIQUE: Multidetector CT imaging of the abdomen and pelvis was performed using the standard protocol following bolus administration of intravenous contrast.  CONTRAST:  77mL OMNIPAQUE IOHEXOL 300 MG/ML  SOLN  COMPARISON:  Patient's prior CT scan dated March 08, 2011 is not available for comparison.  FINDINGS: There are liver cysts larger in  the left lobe measuring 2.8 x 3.2 cm. The liver is otherwise normal. The pancreas, spleen, adrenal glands are normal. There are simple cysts within the left kidney. The right kidney is normal. There is no hydronephrosis bilaterally. The gallbladder is distended. The aorta is normal. There is no abdominal lymphadenopathy.  There is no small bowel obstruction. There is abnormal diffuse bowel wall thickening of the terminal ileum. There is cecal wall thickening. The appendix is not seen but no inflammation is noted surrounding the cecum. There is diffuse bowel wall thickening, but under distended sigmoid colon.  Fluid-filled bladder is normal. Patient is status post prior hysterectomy. Degenerative joint changes of the spine are identified. No focal lytic or blastic lesion is identified.  Images lung bases demonstrate several right pulmonary nodules with right pulmonary mass medial basal segment right lower lobe pulmonary mass measures 1.8 x 2.2 cm.  IMPRESSION: No bowel obstruction. Bowel wall thickening involving the terminal ileum, cecum, and sigmoid colon. This is nonspecific but can be seen in colitis, infectious or inflammatory.  Right pulmonary nodule and mass. Neoplasm is not excluded. Recommend further evaluation with chest CT.   Electronically Signed   By: Abelardo Diesel M.D.   On: 06/20/2015 20:36    Microbiology: Recent Results (from the past 240 hour(s))  Stool culture     Status: None (Preliminary result)   Collection Time: 06/21/15  1:23 AM  Result Value Ref Range Status   Specimen Description STOOL  Final   Special Requests NONE  Final   Culture   Final    Culture reincubated for better growth Performed at [facility]    Report Status PENDING  Incomplete  Clostridium Difficile by PCR (not at [facility])     Status: None   Collection Time: 06/21/15  2:31 PM  Result Value Ref Range Status   C difficile by pcr NEGATIVE NEGATIVE Final     Labs: Basic Metabolic Panel:  Recent  Labs Lab 06/20/15 1815 06/21/15 0507 06/22/15 0407 06/23/15 0356  NA 137 135 136 140  K 3.4* 3.1* 2.9* 3.7  CL 106 105 106 111  CO2 20* 21* 21* 21*  GLUCOSE 88 105* 107* 88  BUN 12 9 5* 5*  CREATININE 0.80 0.82 0.61 0.70  CALCIUM 8.3* 8.1* 8.1* 8.2*   Liver Function Tests:  Recent Labs Lab 06/20/15 1815 06/21/15 0507  AST 19 17  ALT 15 13*  ALKPHOS 51 51  BILITOT 0.8 1.0  PROT 5.8* 5.6*  ALBUMIN 3.2* 2.9*    Recent Labs Lab 06/20/15 1620  LIPASE 17*   No results for input(s): AMMONIA in the last 168 hours.  CBC:  Recent Labs Lab 06/20/15 1815 06/21/15 0507 06/22/15 0407 06/23/15 0356  WBC 3.6* 4.1 4.6 5.0  NEUTROABS 2.7  --   --   --   HGB 11.7* 11.4* 10.5* 10.1*  HCT 33.8* 33.5* 30.6* 29.7*  MCV 87.3 87.0 85.5 86.6  PLT 151 159 170 166   Cardiac Enzymes: No results for input(s): CKTOTAL, CKMB, CKMBINDEX, TROPONINI in the last 168 hours. BNP: BNP (last 3 results) No results for input(s): BNP in the last 8760 hours.  ProBNP (last 3 results) No results for input(s): PROBNP in the last 8760 hours.  CBG: No results for input(s): GLUCAP in the last 168 hours.     SignedLouellen Molder  [facility] 06/23/2015, 10:50 AM

## 2015-06-23 NOTE — Telephone Encounter (Signed)
Order entered for PET (STAT) Nothing further needed.

## 2015-06-23 NOTE — Discharge Instructions (Signed)
Salmonella Gastroenteritis Salmonella gastroenteritis occurs when certain bacteria infect the intestines. People usually begin to feel ill within 72 hours after the infection occurs. The illness can last from 2 days to 2 weeks. Elderly and immunocompromised people are at the greatest risk of this infection. Most people recover completely. However, salmonella bacteria can spread from the intestines to the blood and other parts of the body. In rare cases, a person may develop reactive arthritis with pain in the joints, irritation of the eyes, and painful urination. CAUSES  Salmonella gastroenteritis usually occurs after eating food or drinking liquids that are contaminated with salmonella bacteria. Common causes of this contamination include:  Poor personal hygiene.  Poor kitchen hygiene.  Drinking polluted, standing water.  Contact with carriers of the bacteria. Reptiles are strongly associated with the bacteria, but other animals may carry the bacteria as well. SIGNS AND SYMPTOMS   Nausea.  Vomiting.  Abdominal pain or cramps.  Diarrhea, which may be bloody.  Fever.  Headache. DIAGNOSIS  Your health care provider will take your medical history and perform a physical exam. A blood or stool sample may also be taken and tested for the presence of salmonella bacteria. TREATMENT  Often, no treatment is needed. However, you will need to drink plenty of fluids to prevent dehydration. In severe cases, antibiotic medicines may be given to help shorten the illness. HOME CARE INSTRUCTIONS  Drink enough fluids to keep your urine clear or pale yellow. Until your diarrhea, nausea, or vomiting is under control, you should only drink clear liquids. Clear liquids are anything you can see through, such as water, broth, or non-caffeinated tea. Avoid:  Milk.  Fruit juice.  Alcohol.  Extremely hot or cold fluids.  If you do not have an appetite, do not force yourself to eat. However, you must  continue to drink fluids.  If you have an appetite, eat a normal diet unless your health care provider tells you differently.  Eat a variety of complex carbohydrates (rice, wheat, potatoes, bread), lean meats, yogurt, fruits, and vegetables.  Avoid high-fat foods because they are more difficult to digest.  If you are dehydrated, ask your health care provider for specific rehydration instructions. Signs of dehydration may include:  Severe thirst.  Dry lips and mouth.  Dizziness.  Dark urine.  Decreasing urine frequency and amount.  Confusion.  Rapid breathing or pulse.  If you were prescribed an antibiotic medicine, finish it all even if you start to feel better.  Take medicines only as directed by your health care provider. Antidiarrheal medicines are not recommended.  Keep all follow-up visits as directed by your health care provider. PREVENTION  To prevent future salmonella infections:  Handle meat, eggs, seafood, and poultry properly.  Wash your hands and counters thoroughly after handling or preparing meat, eggs, seafood, and poultry.  Always cook meat, eggs, seafood, and poultry thoroughly.  Wash your hands thoroughly after handling animals. SEEK IMMEDIATE MEDICAL CARE IF:   You are unable to keep fluids down.  You have persistent vomiting or diarrhea.  You have abdominal pain that increases or is concentrated in one small area (localized).  Your diarrhea contains increased blood or mucus.  You feel very weak, dizzy, thirsty, or you faint.  You lose a significant amount of weight. Your health care provider can tell you how much weight loss should concern you.  You have a fever. MAKE SURE YOU:   Understand these instructions.  Will watch your condition.  Will get help   right away if you are not doing well or get worse. Document Released: 11/16/2000 Document Revised: 04/05/2014 Document Reviewed: 01/17/2012 ExitCare Patient Information 2015 [employer]. This information is not intended to replace advice given to you by your health care provider. Make sure you discuss any questions you have with your health care provider.  

## 2015-06-23 NOTE — Care Management Note (Signed)
Case Management Note  Patient Details  Name: NEFERTITI MOHAMAD MRN: [id number] Date of Birth: 17-Jul-1956  Subjective/Objective:                    Action/Plan:   Expected Discharge Date:  06/24/15               Expected Discharge Plan:  Home/Self Care  In-House Referral:     Discharge planning Services  CM Consult  Post Acute Care Choice:    Choice offered to:     DME Arranged:    DME Agency:     HH Arranged:    [place] Agency:     Status of Service:  Completed, signed off  Medicare Important Message Given:    Date Medicare IM Given:    Medicare IM give by:    Date Additional Medicare IM Given:    Additional Medicare Important Message give by:     If discussed at [place] of Stay Meetings, dates discussed:    Additional Comments:  Carles Collet, RN 06/23/2015, 11:35 AM

## 2015-06-24 ENCOUNTER — Telehealth: Payer: Self-pay | Admitting: *Deleted

## 2015-06-24 NOTE — Telephone Encounter (Signed)
Transitional care call attempted.  Left message for patient to return call. 

## 2015-06-28 ENCOUNTER — Telehealth: Payer: Self-pay | Admitting: *Deleted

## 2015-06-28 NOTE — Telephone Encounter (Signed)
See prev note. Dr. Glori Bickers would like another attempt at making a transitional care call

## 2015-06-28 NOTE — Telephone Encounter (Signed)
It looks like a transitional care call was attempted and she was not reachable  She also has appt for f/u of a lung nodule as well -aware  If we can get through to her -I would like to see if she is feeling better

## 2015-06-28 NOTE — Telephone Encounter (Signed)
[facility] called with results-Stool culture + for salmonella; collected on 06/21/15 while inpatient.

## 2015-06-29 ENCOUNTER — Telehealth: Payer: Self-pay

## 2015-06-29 MED ORDER — ALPRAZOLAM 0.5 MG PO TABS
ORAL_TABLET | ORAL | Status: DC
Start: 2015-06-29 — End: 2015-07-08

## 2015-06-29 NOTE — Telephone Encounter (Signed)
Px written for call in  Xanax  Use caution of sedation (someone should drive her)

## 2015-06-29 NOTE — Telephone Encounter (Signed)
Pt returned your call [phone]

## 2015-06-29 NOTE — Telephone Encounter (Signed)
Noted.  Will attempt to recall patient.

## 2015-06-29 NOTE — Telephone Encounter (Signed)
Transitional care call attempted.  Left message for patient to return call. 

## 2015-06-29 NOTE — Telephone Encounter (Signed)
See previous telephone encounter.

## 2015-06-29 NOTE — Telephone Encounter (Signed)
Pt left v/m; pt is scheduled for PET scan on 06/30/15 and pt wants to know if can get one pill for anxiety due to claustrophobia. [facility] [place]. Pt request cb.

## 2015-06-29 NOTE — Telephone Encounter (Signed)
Rx called in as prescribed and pt notified and advise Rx can be sedating

## 2015-06-30 ENCOUNTER — Ambulatory Visit (HOSPITAL_COMMUNITY)
Admission: RE | Admit: 2015-06-30 | Discharge: 2015-06-30 | Disposition: A | Discharge status: Home / Self Care | Payer: No Typology Code available for payment source | Source: Ambulatory Visit | Attending: Pulmonary Disease | Admitting: Pulmonary Disease

## 2015-06-30 DIAGNOSIS — K769 Liver disease, unspecified: Secondary | ICD-10-CM | POA: Diagnosis not present

## 2015-06-30 DIAGNOSIS — R911 Solitary pulmonary nodule: Secondary | ICD-10-CM

## 2015-06-30 DIAGNOSIS — R918 Other nonspecific abnormal finding of lung field: Secondary | ICD-10-CM | POA: Diagnosis present

## 2015-06-30 LAB — GLUCOSE, CAPILLARY: GLUCOSE-CAPILLARY: 87 mg/dL (ref 65–99)

## 2015-06-30 MED ORDER — FLUDEOXYGLUCOSE F - 18 (FDG) INJECTION
6.5000 | Freq: Once | INTRAVENOUS | Status: AC | PRN
  Administered 2015-06-30: 6.5 via INTRAVENOUS

## 2015-06-30 NOTE — Telephone Encounter (Signed)
Transition Care Management Follow-up Telephone Call   Date discharged? 06/23/15   How have you been since you were released from the hospital? Patient reports she is doing well and getting stronger every day.   Do you understand why you were in the hospital? yes   Do you understand the discharge instructions? yes   Where were you discharged to? Home   Items Reviewed:  Medications reviewed: yes  Allergies reviewed: yes  Dietary changes reviewed: n/a  Referrals reviewed: yes   Functional Questionnaire:   Activities of Daily Living (ADLs):   She states they are independent in the following: ambulation, bathing and hygiene, feeding, continence, grooming, toileting and dressing States they require assistance with the following: None   Any transportation issues/concerns?: no   Any patient concerns? no   Confirmed importance and date/time of follow-up visits scheduled yes, patient will call back to schedule follow up  Provider Appointment booked with: n/a  Confirmed with patient if condition begins to worsen call PCP or go to the ER.  Patient was given the office number and encouraged to call back with question or concerns.  : yes

## 2015-07-04 DIAGNOSIS — D259 Leiomyoma of uterus, unspecified: Secondary | ICD-10-CM

## 2015-07-04 HISTORY — DX: Leiomyoma of uterus, unspecified: D25.9

## 2015-07-04 HISTORY — PX: LUNG SURGERY: SHX703

## 2015-07-04 NOTE — Telephone Encounter (Signed)
Patient has not yet scheduled a follow up appointment.  Called patient to schedule.  Left message for patient to return call.

## 2015-07-06 NOTE — Telephone Encounter (Signed)
Follow up scheduled with Dr. Glori Bickers 07/08/15 at 1200.

## 2015-07-07 ENCOUNTER — Telehealth: Payer: Self-pay | Admitting: Pulmonary Disease

## 2015-07-07 NOTE — Telephone Encounter (Signed)
Patient notified.  Nothing further needed. 

## 2015-07-07 NOTE — Telephone Encounter (Signed)
Spoke with the pt and notified needs sooner appt to discuss ct results  She is scheduled for 07/11/15 at 1:45  She is anxious about getting bad news and wants RA to call her to discuss things a little prior to visit so she does not have to worry all wkend  I advised will send this msg to RA to see if he can please call her

## 2015-07-07 NOTE — Telephone Encounter (Signed)
PET scan actually favors benign lesion - low level activity  So no bnad news - would like to discuss plan

## 2015-07-08 ENCOUNTER — Encounter: Payer: Self-pay | Admitting: Family Medicine

## 2015-07-08 ENCOUNTER — Ambulatory Visit (INDEPENDENT_AMBULATORY_CARE_PROVIDER_SITE_OTHER): Payer: No Typology Code available for payment source | Admitting: Family Medicine

## 2015-07-08 VITALS — BP 94/62 | HR 66 | Temp 98.2°F | Ht 66.0 in | Wt 132.5 lb

## 2015-07-08 DIAGNOSIS — E876 Hypokalemia: Secondary | ICD-10-CM | POA: Diagnosis not present

## 2015-07-08 DIAGNOSIS — A020 Salmonella enteritis: Secondary | ICD-10-CM

## 2015-07-08 DIAGNOSIS — R197 Diarrhea, unspecified: Secondary | ICD-10-CM | POA: Diagnosis not present

## 2015-07-08 LAB — CBC WITH DIFFERENTIAL/PLATELET
Basophils Absolute: 0 10*3/uL (ref 0.0–0.1)
Basophils Relative: 1.2 % (ref 0.0–3.0)
EOS ABS: 0.1 10*3/uL (ref 0.0–0.7)
EOS PCT: 2.1 % (ref 0.0–5.0)
HEMATOCRIT: 38.7 % (ref 36.0–46.0)
Hemoglobin: 13 g/dL (ref 12.0–15.0)
Lymphocytes Relative: 41.1 % (ref 12.0–46.0)
Lymphs Abs: 1.6 10*3/uL (ref 0.7–4.0)
MCHC: 33.5 g/dL (ref 30.0–36.0)
MCV: 89.6 fl (ref 78.0–100.0)
Monocytes Absolute: 0.2 10*3/uL (ref 0.1–1.0)
Monocytes Relative: 5.8 % (ref 3.0–12.0)
Neutro Abs: 1.9 10*3/uL (ref 1.4–7.7)
Neutrophils Relative %: 49.8 % (ref 43.0–77.0)
Platelets: 316 10*3/uL (ref 150.0–400.0)
RBC: 4.32 Mil/uL (ref 3.87–5.11)
RDW: 14 % (ref 11.5–15.5)
WBC: 3.9 10*3/uL — AB (ref 4.0–10.5)

## 2015-07-08 LAB — BASIC METABOLIC PANEL
BUN: 10 mg/dL (ref 6–23)
CALCIUM: 9.5 mg/dL (ref 8.4–10.5)
CHLORIDE: 103 meq/L (ref 96–112)
CO2: 29 meq/L (ref 19–32)
Creatinine, Ser: 0.68 mg/dL (ref 0.40–1.20)
GFR: 94.85 mL/min (ref >60.00)
GLUCOSE: 85 mg/dL (ref 70–99)
POTASSIUM: 4.5 meq/L (ref 3.5–5.1)
SODIUM: 137 meq/L (ref 135–145)

## 2015-07-08 NOTE — Progress Notes (Signed)
Subjective:    Patient ID: Brooke Morrow, female    DOB: [birth year], 59 y.o.   MRN: [id number]  HPI  Here for f/u of hosp 7/18 to 06/23/15 For n/v/d   Transitional care call made 7/22  Pt had tx with flagyl (had been on doxy since tick bite by UC), and then d/c with cipro Has hx of chronic diarrhea  K was low at admit but was treated     Chemistry      Component Value Date/Time   NA 140 06/23/2015 0356   K 3.7 06/23/2015 0356   CL 111 06/23/2015 0356   CO2 21* 06/23/2015 0356   BUN 5* 06/23/2015 0356   CREATININE 0.70 06/23/2015 0356   CREATININE 0.77 10/27/2013 1225      Component Value Date/Time   CALCIUM 8.2* 06/23/2015 0356   ALKPHOS 51 06/21/2015 0507   AST 17 06/21/2015 0507   ALT 13* 06/21/2015 0507   BILITOT 1.0 06/21/2015 0507     Lab Results  Component Value Date   WBC 5.0 06/23/2015   HGB 10.1* 06/23/2015   HCT 29.7* 06/23/2015   MCV 86.6 06/23/2015   PLT 166 06/23/2015    Anemic Of note stool was heme neg c diff test was negative  Salmonella pos - and treated   Will f/u with Gi Dr Amedeo Plenty   Also found R lung nodule  Saw pulm and had a PET scan for this  Nm Pet Image Initial (pi) Skull Base To Thigh  06/30/2015   CLINICAL DATA:  Initial Treatment strategy for lung nodules. Staging examination.  EXAM: NUCLEAR MEDICINE PET SKULL BASE TO THIGH  TECHNIQUE: 6.5 mCi F-18 FDG was injected intravenously. Full-ring PET imaging was performed from the skull base to thigh after the radiotracer. CT data was obtained and used for attenuation correction and anatomic localization.  FASTING BLOOD GLUCOSE:  Value: 87 mg/dl  COMPARISON:  CT of the chest 06/21/2015. CT of the abdomen and pelvis 06/20/2015.  FINDINGS: NECK  No hypermetabolic lymph nodes in the neck.  CHEST  Again noted are several pulmonary nodules in the lungs bilaterally, which appears similar in size, number and distribution to the recent chest CT. The largest of these nodules is a macrolobulated  pleural-based lesion measuring 2.2 x 1.7 cm (image 47 of series 8) in the medial aspect of the right lower lobe, which demonstrates only low-level metabolic activity (SUVmax = 2.1). The other smaller nodules also demonstrate no clearly hypermetabolic activity. There is a greater number of pulmonary nodules in the right lung than in the left, particularly in the right lower lobe where there are 3 other relatively prominent pulmonary nodules measuring 7 mm (image 42 of series 8), 8 mm (image 43 of series 8), and a 9 mm (image 38 of series 8). No pathologically enlarged or hypermetabolic mediastinal or hilar lymph nodes. No hypermetabolic mediastinal or hilar nodes.  ABDOMEN/PELVIS  No abnormal hypermetabolic activity within the liver, pancreas, adrenal glands, or spleen. No hypermetabolic lymph nodes in the abdomen or pelvis. Several well-defined low-attenuation lesions in the liver are incompletely characterized on today's noncontrast CT examination, however, these do not demonstrate significant hypermetabolism, and are favored to represent cysts, including the largest lesion which measures 3.2 cm in diameter in segment 2. No significant volume of ascites. No pneumoperitoneum. No pathologic distention of small bowel. Normal appendix.  SKELETON  No focal hypermetabolic activity to suggest skeletal metastasis.  IMPRESSION: 1. Multiple pulmonary nodules in the lungs bilaterally (  right greater than left). The largest of these measures 2.2 x 1.7 cm in the medial aspect of the right lower lobe, and demonstrates only low-level metabolic activity. Findings are nonspecific, but favor benign entities such as multiple carcinoid tumors, or multiple are hamartomas. Repeat noncontrast chest CT is recommended in 3-6 months to ensure continued stability at this time. 2. Additional incidental findings, as above.   Electronically Signed   By: Vinnie Langton M.D.   On: 06/30/2015 16:18  She meets with pulm on Monday   Today  Wt  is down 11 lb with bmi of 21  Diarrhea has continued some  Finished cipro last Thursday - and diarrhea with abd pain comes and goes  Thinks she got the salonella in [place] ? Poss  No more n/v  Sees Dr Amedeo Plenty at end of the month   Chronic diarrhea - has had extensive w/u in the past incl colonoscopy and bx   No energy so far  bp is still on the low side  BP Readings from Last 3 Encounters:  07/08/15 94/62  06/23/15 93/55  06/18/15 122/74     Patient Active Problem List   Diagnosis Date Noted  . Salmonella enteritis 06/23/2015  . Hypokalemia 06/23/2015  . 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 Abdominal pain, diffuse   . Diarrhea 06/20/2015  . Intractable nausea and vomiting 06/20/2015  . Ear pain, right 05/12/2014  . Temple tenderness 05/12/2014  . Left shoulder pain 05/07/2014  . Acute sinusitis 04/13/2014  . Fibroid   . Stress reaction 03/04/2012  . 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 Tick bite 03/02/2011  . 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 HEMOCCULT POSITIVE STOOL 10/22/2009   Past Medical History  Diagnosis Date  . Heart murmur     faint  . [facility] spotted fever)    Past Surgical History  Procedure Laterality Date  . Cesarean section    . Tonsillectomy    . Vaginal hysterectomy  2007    Posterior colporrhaphy for leiomyoma and rectocele   History  Substance Use Topics  . Smoking status: Former Smoker    Quit date: 02/27/2006  . Smokeless tobacco: Never Used  . Alcohol Use: 0.0 oz/week    0 Standard drinks or equivalent per week     Comment: occasional wine   Family History  Problem Relation Age of Onset  . Heart disease Mother 35    bypass and stents   . Hypertension Mother   . Heart disease Maternal Aunt   . Diabetes Maternal Aunt   . Heart disease Maternal Uncle   . Diabetes Maternal Uncle   . Heart disease Maternal Grandmother     pacemaker, stents  . Diabetes Maternal Grandmother   . Heart disease Maternal  Grandfather 34  . Diabetes Cousin    Allergies  Allergen Reactions  . Codeine     REACTION: could not stay alert   Current Outpatient Prescriptions on File Prior to Visit  Medication Sig Dispense Refill  . bismuth subsalicylate (PEPTO BISMOL) 262 MG/15ML suspension Take 15 mLs by mouth as needed.    . cetirizine (ZYRTEC) 10 MG tablet Take 10 mg by mouth daily as needed for allergies.     . fluticasone (FLONASE) 50 MCG/ACT nasal spray Place 1 spray into both nostrils daily as needed for allergies.     Marland Kitchen ibuprofen (ADVIL,MOTRIN) 200 MG tablet Take 200 mg by mouth every 4 (four) hours as needed for  moderate pain. OTC as directed    . lidocaine (XYLOCAINE) 2 % solution SWISH, GARGLE, & SPIT (DO NOT SWALLOW) 15ML'S BY MOUTH 3 TO 4 TIMES A DAY AS NEEDED FOR ULCERS 400 mL 2  . loperamide (IMODIUM) 2 MG capsule Take 2 mg by mouth daily as needed for diarrhea or loose stools.     No current facility-administered medications on file prior to visit.       Review of Systems Review of Systems  Constitutional: Negative for fever, appetite change,  and unexpected weight change. pos for fatigue that is gradually improving  Eyes: Negative for pain and visual disturbance.  Respiratory: Negative for cough and shortness of breath.   Cardiovascular: Negative for cp or palpitations    Gastrointestinal: Negative for nausea,  and constipation. neg for vomiting/ blood in stool, pos for baseline diarrhea that is much improved  Genitourinary: Negative for urgency and frequency.  Skin: Negative for pallor or rash   Neurological: Negative for weakness, light-headedness, numbness and headaches.  Hematological: Negative for adenopathy. Does not bruise/bleed easily.  Psychiatric/Behavioral: Negative for dysphoric mood. The patient is not nervous/anxious.         Objective:   Physical Exam  Constitutional: She appears well-developed and well-nourished. No distress.  Slim and well appearing   HENT:  Head:  Normocephalic and atraumatic.  Mouth/Throat: Oropharynx is clear and moist.  Eyes: Conjunctivae and EOM are normal. Pupils are equal, round, and reactive to light. No scleral icterus.  Neck: Normal range of motion. Neck supple.  Cardiovascular: Normal rate, regular rhythm and normal heart sounds.   Pulmonary/Chest: Effort normal and breath sounds normal. No respiratory distress. She has no wheezes. She has no rales.  Abdominal: Soft. Bowel sounds are normal. She exhibits no distension, no pulsatile midline mass and no mass. There is no hepatosplenomegaly. There is no tenderness. There is no rebound, no guarding and no CVA tenderness.  Lymphadenopathy:    She has no cervical adenopathy.  Neurological: She is alert.  Skin: Skin is warm and dry. No erythema. No pallor.  Nl skin turgor Brisk capillary refill   Psychiatric: She has a normal mood and affect.  Nursing note and vitals reviewed.         Assessment & Plan:   Problem List Items Addressed This Visit    Diarrhea - Primary    Pt had acute on chronic diarrhea caused by salmonella Improved after tx with cipro  Does have ongoing chronic diarrhea/ IBS - sees GI and has f/u Dr Amedeo Plenty  Rev hospital notes/results in detail  Hydration status is improving       Hypokalemia    Low K at admission- this was corrected From n/v/d  Re check today Enc fluid intake as well as balanced nutrition       Relevant Orders   Basic metabolic panel (Completed)   Salmonella enteritis    Pt was hosp with n/v/d with electrolyte abnormalities  Much improved clinically  Rev hosp records and studies with pt in detail (incl f/u for incidental pulm nodule with pulm)  F/u lab today F/u with GI as planned   Will update if worse or no further imp  Disc imp of hydration and gradual return to a regular diet       Relevant Orders   Basic metabolic panel (Completed)   CBC with Differential/Platelet (Completed)

## 2015-07-08 NOTE — Patient Instructions (Signed)
Labs today  Keep hydrating  You need more salt -it is ok to eat some salty and fatty things Sugar is ok for now too  Follow up with Dr Amedeo Plenty as planned

## 2015-07-08 NOTE — Progress Notes (Signed)
Pre visit review using our clinic review tool, if applicable. No additional management support is needed unless otherwise documented below in the visit note. 

## 2015-07-10 NOTE — Assessment & Plan Note (Signed)
Pt was hosp with n/v/d with electrolyte abnormalities  Much improved clinically  Rev hosp records and studies with pt in detail (incl f/u for incidental pulm nodule with pulm)  F/u lab today F/u with GI as planned   Will update if worse or no further imp  Disc imp of hydration and gradual return to a regular diet

## 2015-07-10 NOTE — Assessment & Plan Note (Signed)
Pt had acute on chronic diarrhea caused by salmonella Improved after tx with cipro  Does have ongoing chronic diarrhea/ IBS - sees GI and has f/u Dr Amedeo Plenty  Rev hospital notes/results in detail  Hydration status is improving

## 2015-07-10 NOTE — Assessment & Plan Note (Signed)
Low K at admission- this was corrected From n/v/d  Re check today Enc fluid intake as well as balanced nutrition

## 2015-07-11 ENCOUNTER — Ambulatory Visit (INDEPENDENT_AMBULATORY_CARE_PROVIDER_SITE_OTHER): Payer: No Typology Code available for payment source | Admitting: Pulmonary Disease

## 2015-07-11 ENCOUNTER — Encounter: Payer: Self-pay | Admitting: Pulmonary Disease

## 2015-07-11 VITALS — BP 100/68 | HR 86 | Ht 66.0 in | Wt 132.8 lb

## 2015-07-11 DIAGNOSIS — R918 Other nonspecific abnormal finding of lung field: Secondary | ICD-10-CM | POA: Diagnosis not present

## 2015-07-11 NOTE — Progress Notes (Signed)
   Subjective:    Patient ID: Brooke Morrow, female    DOB: 09/22/1956, 59 y.o.   MRN: [id number]  HPI  59 year old remote smoker for FU of multiple lung nodules .  She reported exposure to tick bite when she takes her horse out to pasture. She has a history of [facility] spotted fever 8 years ago and reports chronic problems since then including diarrhea in 2012 and she had workup including colonoscopy- Amedeo Plenty. She was seen at urgent care and started on doxycycline. She also reports nausea and vomiting at onset. 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 Follow-up    PET scan results.     Accompanied by her husband, diarrhea resolved  SIGNIFICANT EVENTS  06/2015 hosp for severe diarrhea/ abd pain  for 4-5 days - finally diagnosed as salmonella enteritis  STUDIES:  CT abdomen 7/18 liver cysts, right lower lobe pulmonary nodule 1.82.2 cm, diffuse bowel thickening of terminal ileum and cecum  CT chest 06/21/15  2.1 cm nodule in the posterior right lower lobe, multiple 5-7 mm nodules in the right lung  PET 06/30/15 low-level metabolic activity -SUV 2.1    Review of Systems neg for any significant sore throat, dysphagia, itching, sneezing, nasal congestion or excess/ purulent secretions, fever, chills, sweats, unintended wt loss, pleuritic or exertional cp, hempoptysis, orthopnea pnd or change in chronic leg swelling. Also denies presyncope, palpitations, heartburn, abdominal pain, nausea, vomiting, diarrhea or change in bowel or urinary habits, dysuria,hematuria, rash, arthralgias, visual complaints, headache, numbness weakness or ataxia.     Objective:   Physical Exam  Gen. Pleasant, well-nourished, in no distress ENT - no lesions, no post nasal drip Neck: No JVD, no thyromegaly, no carotid bruits Lungs: no use of accessory muscles, no dullness  to percussion, clear without rales or rhonchi  Cardiovascular: Rhythm regular, heart sounds  normal, no murmurs or gallops, no peripheral edema Musculoskeletal: No deformities, no cyanosis or clubbing        Assessment & Plan:

## 2015-07-11 NOTE — Assessment & Plan Note (Addendum)
We discussed PET scan results The various options of biopsy including  CT guided needle aspiration and surgical biopsy were discussed.The risks of each procedure including coughing, bleeding and the  chances of lung puncture requiring chest tube were discussed in great detail. The benefits & alternatives including serial follow up were also discussed.  Will discuss in multidisc conference & decide between serial CT vs biopsy

## 2015-07-11 NOTE — Patient Instructions (Signed)
We discussed PET scan results The various options of biopsy including  CT guided needle aspiration and surgical biopsy were discussed.The risks of each procedure including coughing, bleeding and the  chances of lung puncture requiring chest tube were discussed in great detail. The benefits & alternatives including serial follow up were also discussed.

## 2015-07-12 ENCOUNTER — Encounter: Payer: Self-pay | Admitting: Family Medicine

## 2015-07-14 ENCOUNTER — Telehealth: Payer: Self-pay | Admitting: Family Medicine

## 2015-07-14 ENCOUNTER — Telehealth: Payer: Self-pay | Admitting: Pulmonary Disease

## 2015-07-14 DIAGNOSIS — R918 Other nonspecific abnormal finding of lung field: Secondary | ICD-10-CM

## 2015-07-14 NOTE — Telephone Encounter (Signed)
Referral to [facility] pulmonology for a lung nodule She has seen Dr Elsworth Soho but wants a 2nd opinion before proceeding with a biopsy  I will route to [facility]

## 2015-07-14 NOTE — Telephone Encounter (Signed)
I discussed at multidisciplinary conference & biopsy was advised. If agreeable, proceed with CT guided needle biopsy of lung mass

## 2015-07-14 NOTE — Telephone Encounter (Signed)
Called spoke with pt. She wants to speak with her spouse regarding this and will call us back once they make a decision.

## 2015-07-14 NOTE — Telephone Encounter (Signed)
lmtcb

## 2015-07-14 NOTE — Telephone Encounter (Signed)
Pt returned call [phone]

## 2015-07-14 NOTE — Telephone Encounter (Signed)
Appt made with Dr Wilber Oliphant at [facility] on 07/18/15 at 1pm, all records faxed and disc requested at [facility]. Patient will bring disc to her appt at [facility].

## 2015-07-20 LAB — STOOL CULTURE

## 2015-07-26 ENCOUNTER — Encounter: Payer: Self-pay | Admitting: Family Medicine

## 2015-07-27 ENCOUNTER — Inpatient Hospital Stay: Payer: No Typology Code available for payment source | Admitting: Pulmonary Disease

## 2015-07-28 NOTE — Telephone Encounter (Signed)
Patient contacted Dr. Marliss Coots office and requested another referral to [facility] for 2nd opinion before going through with biopsy. Nothing further needed.

## 2015-09-20 ENCOUNTER — Encounter: Payer: Self-pay | Admitting: Family Medicine

## 2015-09-23 ENCOUNTER — Telehealth: Payer: Self-pay | Admitting: Family Medicine

## 2015-09-23 MED ORDER — AZELASTINE HCL 0.15 % NA SOLN: 1.0000 | Freq: Two times a day (BID) | NASAL | Status: DC

## 2015-09-23 NOTE — Telephone Encounter (Signed)
Any green or yellow nasal d/c/ fever or sinus pain?   Does she think it could be a cold or sinus infection ? What decongestant is she taking?

## 2015-09-23 NOTE — Telephone Encounter (Signed)
Pt called. Would like to get Dr. Marliss Coots suggestions on what other meds she can take for her allergies. Right now she is taking certrizine OTC, and decongestiant pill, and flonase. It is not helping with cough nor head "stuffiness".  Pt c/b # [phone]

## 2015-09-23 NOTE — Telephone Encounter (Signed)
Pt said she does have a little yellow nasal dc, pt said no sinus pain and she doesn't think she has a cold she thinks it's just allergies, pt said she is taking a decongestant but she isn't sure of name she said it's a little red pill and it's a decongestant med by its self with no other cough/cold meds mixed with it, please advise

## 2015-09-23 NOTE — Telephone Encounter (Signed)
Left detailed voicemail letting pt know Dr. Marliss Coots instructions and that Rx was sent to pharmacy

## 2015-09-23 NOTE — Telephone Encounter (Signed)
If she has some yellow nasal d/c - it may not be just allergies (could be a cold)  Sounds like she is likely on sudafed  Continue cetrizine and flonase Please call in astepro (another nasal spray)- see if this helps additionally  F/u if worse or no improvement

## 2015-11-21 ENCOUNTER — Other Ambulatory Visit: Payer: Self-pay | Admitting: Family Medicine

## 2015-11-21 NOTE — Telephone Encounter (Signed)
Please refill times 5 

## 2015-11-21 NOTE — Telephone Encounter (Signed)
Not on current med list but Rx is on prev med list, please advise

## 2015-11-23 ENCOUNTER — Other Ambulatory Visit: Payer: Self-pay | Admitting: Family Medicine

## 2015-11-23 NOTE — Telephone Encounter (Signed)
Please refill times one  

## 2015-11-23 NOTE — Telephone Encounter (Signed)
done

## 2015-11-23 NOTE — Telephone Encounter (Signed)
Last OV was a hospital f/u on 07/08/15, last refilled on 09/01/14 #453ml with 2 additional refills, please advise

## 2015-11-24 ENCOUNTER — Encounter: Payer: Self-pay | Admitting: Family Medicine

## 2015-11-24 NOTE — Telephone Encounter (Signed)
Pt's bottle didn't have the ingredients but she remembered the Rx came from her ENT, I found ENT note and it had the ingredients in his OV note, called pharmacy and advise the pharmacist Kayla of the information  FYI ingredients are: lidocaine 2% 35ml, Mylanta 54ml, diphenlydramine 12.5mg /5 ml 28ml, nystatin 100,000 IU 49ml, prednisolone 5mg /64ml

## 2015-11-24 NOTE — Telephone Encounter (Signed)
Called the pharmacy and pharmacist said that they couldn't look at last Rx to get the direct meds in Rx because it has been over a year since the original Rx was prescribed, called pt and she advise me she had an empty bottle of Rx that has the exact meds and quantity of meds so she will take it by the pharmacy but if the pharmacy has anymore questions after she takes the bottle to them she will call us back

## 2015-11-24 NOTE — Telephone Encounter (Signed)
Pt left v/m requesting status of med sent to [facility].

## 2015-11-24 NOTE — Telephone Encounter (Signed)
Kayla with [facility] left v/m to clarify if this is magic mouthwash;Kayla request cb.

## 2015-12-30 ENCOUNTER — Encounter: Payer: Self-pay | Admitting: Family Medicine

## 2016-01-02 ENCOUNTER — Other Ambulatory Visit: Payer: Self-pay

## 2016-01-02 DIAGNOSIS — Z1231 Encounter for screening mammogram for malignant neoplasm of breast: Secondary | ICD-10-CM

## 2016-01-04 ENCOUNTER — Encounter: Payer: Self-pay | Admitting: Family Medicine

## 2016-01-04 ENCOUNTER — Ambulatory Visit (INDEPENDENT_AMBULATORY_CARE_PROVIDER_SITE_OTHER): Payer: No Typology Code available for payment source | Admitting: Family Medicine

## 2016-01-04 VITALS — BP 100/80 | HR 101 | Temp 97.6°F | Ht 66.0 in | Wt 127.0 lb

## 2016-01-04 DIAGNOSIS — J019 Acute sinusitis, unspecified: Secondary | ICD-10-CM

## 2016-01-04 MED ORDER — AMOXICILLIN-POT CLAVULANATE 875-125 MG PO TABS: 1.0000 | ORAL_TABLET | Freq: Two times a day (BID) | ORAL | Status: DC

## 2016-01-04 NOTE — Patient Instructions (Signed)

## 2016-01-04 NOTE — Progress Notes (Signed)
   Subjective:    Patient ID: Brooke Morrow, female    DOB: Jun 19, 1956, 60 y.o.   MRN: [id number]  HPI Patient is seen today with right earache for the past 3-4 days and some progressive sinus congestion for several days. She has perennial allergy tendencies and already takes Flonase, Astelin, and Sudafed along with Zyrtec. She's had some colored nasal discharge past couple days. Mild facial pain. No fever or chills. She states she tends to get sinus infections about once or twice per year.  Past Medical History  Diagnosis Date  . Heart murmur     faint  . RMSF [facility] spotted fever)   . Lung cancer ([place]) 07/26/2015   Past Surgical History  Procedure Laterality Date  . Cesarean section    . Tonsillectomy    . Vaginal hysterectomy  2007    Posterior colporrhaphy for leiomyoma and rectocele    reports that she quit smoking about 9 years ago. She has never used smokeless tobacco. She reports that she drinks alcohol. She reports that she does not use illicit drugs. family history includes Diabetes in her cousin, maternal aunt, maternal grandmother, and maternal uncle; Heart disease in her maternal aunt, maternal grandmother, and maternal uncle; Heart disease (age of onset: 38) in her maternal grandfather; Heart disease (age of onset: 72) in her mother; Hypertension in her mother. Allergies  Allergen Reactions  . Codeine     REACTION: could not stay alert      Review of Systems  Constitutional: Negative for fever and chills.  HENT: Positive for congestion, ear pain and sinus pressure.   Respiratory: Negative for cough.        Objective:   Physical Exam  Constitutional: She appears well-developed and well-nourished.  HENT:  Right Ear: External ear normal.  Left Ear: External ear normal.  Mouth/Throat: Oropharynx is clear and moist.  Minimal dried blood right naris  Neck: Neck supple.  Cardiovascular: Normal rate and regular rhythm.   Pulmonary/Chest: Effort  normal and breath sounds normal. No respiratory distress. She has no wheezes. She has no rales.  Lymphadenopathy:    She has no cervical adenopathy.          Assessment & Plan:  Sinus congestion and right otalgia. No signs of otitis media. We have not recommended antibiotics unless she has any persistent sinus congestive symptoms or progressive symptoms- would then start Augmentin. Continue with her nasal sprays as above and Sudafed

## 2016-01-04 NOTE — Progress Notes (Signed)
Pre visit review using our clinic review tool, if applicable. No additional management support is needed unless otherwise documented below in the visit note. 

## 2016-01-17 ENCOUNTER — Encounter: Payer: Self-pay | Admitting: Gynecology

## 2016-01-18 ENCOUNTER — Ambulatory Visit
Admission: RE | Admit: 2016-01-18 | Discharge: 2016-01-18 | Disposition: A | Discharge status: Home / Self Care | Payer: No Typology Code available for payment source | Source: Ambulatory Visit

## 2016-01-18 DIAGNOSIS — Z1231 Encounter for screening mammogram for malignant neoplasm of breast: Secondary | ICD-10-CM

## 2016-01-20 ENCOUNTER — Other Ambulatory Visit: Payer: Self-pay | Admitting: Gynecology

## 2016-01-20 DIAGNOSIS — R928 Other abnormal and inconclusive findings on diagnostic imaging of breast: Secondary | ICD-10-CM

## 2016-01-24 ENCOUNTER — Ambulatory Visit
Admission: RE | Admit: 2016-01-24 | Discharge: 2016-01-24 | Disposition: A | Discharge status: Home / Self Care | Payer: No Typology Code available for payment source | Source: Ambulatory Visit | Attending: Gynecology | Admitting: Gynecology

## 2016-01-24 DIAGNOSIS — R928 Other abnormal and inconclusive findings on diagnostic imaging of breast: Secondary | ICD-10-CM

## 2016-01-26 ENCOUNTER — Ambulatory Visit (INDEPENDENT_AMBULATORY_CARE_PROVIDER_SITE_OTHER): Payer: No Typology Code available for payment source | Admitting: Gynecology

## 2016-01-26 ENCOUNTER — Other Ambulatory Visit (HOSPITAL_COMMUNITY)
Admission: RE | Admit: 2016-01-26 | Discharge: 2016-01-26 | Disposition: A | Discharge status: Home / Self Care | Payer: No Typology Code available for payment source | Source: Ambulatory Visit | Attending: Gynecology | Admitting: Gynecology

## 2016-01-26 ENCOUNTER — Encounter: Payer: Self-pay | Admitting: Gynecology

## 2016-01-26 VITALS — BP 120/76 | Ht 65.5 in | Wt 126.0 lb

## 2016-01-26 DIAGNOSIS — Z01419 Encounter for gynecological examination (general) (routine) without abnormal findings: Secondary | ICD-10-CM | POA: Insufficient documentation

## 2016-01-26 DIAGNOSIS — C55 Malignant neoplasm of uterus, part unspecified: Secondary | ICD-10-CM

## 2016-01-26 DIAGNOSIS — D259 Leiomyoma of uterus, unspecified: Secondary | ICD-10-CM | POA: Diagnosis not present

## 2016-01-26 DIAGNOSIS — N952 Postmenopausal atrophic vaginitis: Secondary | ICD-10-CM | POA: Diagnosis not present

## 2016-01-26 NOTE — Patient Instructions (Signed)

## 2016-01-26 NOTE — Progress Notes (Signed)
Brooke Morrow 1956/05/22 [id number]        60 y.o.  [id number]  for annual exam.  Has had an eventful year with the diagnosis of benign metastasizing leiomyoma as an incidental finding on CT scan done for GI symptoms. She underwent a biopsy and subsequent right lower lung wedge resection 07/2015. Most recent follow up with her physicians at [facility] shows stable studies with planned follow up in May.  Past medical history,surgical history, problem list, medications, allergies, family history and social history were all reviewed and documented as reviewed in the EPIC chart.  ROS:  Performed with pertinent positives and negatives included in the history, assessment and plan.   Additional significant findings :  none   Exam: [name] assistant Filed Vitals:   01/26/16 0827  BP: 120/76  Height: 5' 5.5" (1.664 m)  Weight: 126 lb (57.153 kg)   General appearance:  Normal affect, orientation and appearance. Skin: Grossly normal HEENT: Without gross lesions.  No cervical or supraclavicular adenopathy. Thyroid normal.  Lungs:  Clear without wheezing, rales or rhonchi Cardiac: RR, without RMG Abdominal:  Soft, nontender, without masses, guarding, rebound, organomegaly or hernia Breasts:  Examined lying and sitting without masses, retractions, discharge or axillary adenopathy. Pelvic:  Ext/BUS/vagina with atrophic changes  Adnexa without masses or tenderness    Anus and perineum normal   Rectovaginal normal sphincter tone without palpated masses or tenderness.    Assessment/Plan:  60 y.o. [id number] female for annual exam.   1. History vaginal hysterectomy, posterior colporrhaphy 2007 for leiomyoma and rectocele. Without significant hot flushes, night sweats or vaginal dryness. Continue to monitor and report any issues. 2. Recent diagnosis of benign metastasizing leiomyoma. Very actively being followed at [facility]. Patient had [facility] documenting postmenopausal status. She discussed with them whether  removal of her ovaries would be of benefit but they did not think so. She'll continue to follow up with them in reference to this.  Her notes from [facility] are all available under care everywhere in [place]. 3. Mammography 01/2016. Continue with annual mammography when due. SBE monthly reviewed. 4. DEXA never. Recommended baseline DEXA given her thin caucasian status. Patient will schedule. 5. Pap smear 2011. Pap smear of vaginal cuff done today. Options to stop screening based on current guidelines status post hysterectomy reviewed. Given her most recent diagnosis baseline Pap smear done. 6. Colonoscopy 2012. Repeat at their recommended interval. 7. Health maintenance. No routine lab work done as patient reports is done through her primary physician's office. Follow up in one year, sooner as needed.   [name] MD, 9:59 AM 01/26/2016

## 2016-01-27 LAB — CYTOLOGY - PAP

## 2016-01-31 ENCOUNTER — Ambulatory Visit (INDEPENDENT_AMBULATORY_CARE_PROVIDER_SITE_OTHER): Payer: No Typology Code available for payment source

## 2016-01-31 ENCOUNTER — Other Ambulatory Visit: Payer: Self-pay | Admitting: Gynecology

## 2016-01-31 DIAGNOSIS — Z13820 Encounter for screening for osteoporosis: Secondary | ICD-10-CM

## 2016-02-14 ENCOUNTER — Ambulatory Visit: Payer: No Typology Code available for payment source | Admitting: Family Medicine

## 2016-02-14 ENCOUNTER — Encounter: Payer: Self-pay | Admitting: Family Medicine

## 2016-02-21 ENCOUNTER — Encounter: Payer: Self-pay | Admitting: Family Medicine

## 2016-02-21 ENCOUNTER — Ambulatory Visit (INDEPENDENT_AMBULATORY_CARE_PROVIDER_SITE_OTHER): Payer: No Typology Code available for payment source | Admitting: Family Medicine

## 2016-02-21 VITALS — BP 118/62 | HR 90 | Temp 99.7°F | Ht 65.5 in | Wt 122.8 lb

## 2016-02-21 DIAGNOSIS — R509 Fever, unspecified: Secondary | ICD-10-CM

## 2016-02-21 DIAGNOSIS — J209 Acute bronchitis, unspecified: Secondary | ICD-10-CM | POA: Diagnosis not present

## 2016-02-21 DIAGNOSIS — R918 Other nonspecific abnormal finding of lung field: Secondary | ICD-10-CM

## 2016-02-21 LAB — POC INFLUENZA A&B (BINAX/QUICKVUE)
INFLUENZA A, POC: NEGATIVE
Influenza B, POC: NEGATIVE

## 2016-02-21 MED ORDER — BENZONATATE 200 MG PO CAPS: 200.0000 mg | ORAL_CAPSULE | Freq: Three times a day (TID) | ORAL | Status: DC | PRN

## 2016-02-21 MED ORDER — LEVOFLOXACIN 500 MG PO TABS: 500.0000 mg | ORAL_TABLET | Freq: Every day | ORAL | Status: DC

## 2016-02-21 NOTE — Progress Notes (Signed)
Subjective:    Patient ID: Brooke Morrow, female    DOB: [birth year], 58 y.o.   MRN: [id number]  HPI Here for cough and cold symptoms  Started with nasal symptoms  Started zycam  Then she developed more prod cough - brown sputum  Sounded wheezy Temp of 99.7 Is 100-101 at home  Taking dayquil and nyquil occ ibuprofen   She went to work Friday and yesterday   Ears have started hurting a bit  Nasal congestion   Flu swab today is negative  Results for orders placed or performed in visit on 02/21/16  POC Influenza A&B(BINAX/QUICKVUE)  Result Value Ref Range   Influenza A, POC Negative Negative   Influenza B, POC Negative Negative     Seen at [facility] for B9 metastasizing leiomyoma - having regular CT scans  A long course with this  Cannot catch a break to get back in shape   Went in for a stress test last week - and did well with that  There was a question of dec motion in one area - the chief of cardiology reassuring her  She still has some R hand and arm pain - (that is what they did it for)  Just finished PT -? If she injured it    Patient Active Problem List   Diagnosis Date Noted  . Lung cancer ([place]) 07/26/2015  . 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 Fibroid   . Stress reaction 03/04/2012  . 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 Tick bite 03/02/2011  . 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 HEMOCCULT POSITIVE STOOL 10/22/2009   Past Medical History  Diagnosis Date  . Heart murmur     faint  . RMS[facility] spotted fever)   . Benign metastasizing leiomyoma of uterus   . Benign metastasizing leiomyoma of uterus 07/2015   Past Surgical History  Procedure Laterality Date  . Cesarean section    . Tonsillectomy    . Vaginal hysterectomy  2007    Posterior colporrhaphy for leiomyoma and rectocele  . Lung surgery  07/2015   Social History  Substance Use Topics  . Smoking status: Former Smoker    Quit date: 02/27/2006  . Smokeless tobacco:  Never Used  . Alcohol Use: 0.0 oz/week    0 Standard drinks or equivalent per week     Comment: occasional wine   Family History  Problem Relation Age of Onset  . Heart disease Mother 86    bypass and stents   . Hypertension Mother   . Heart disease Maternal Aunt   . Diabetes Maternal Aunt   . Heart disease Maternal Uncle   . Diabetes Maternal Uncle   . Heart disease Maternal Grandmother     pacemaker, stents  . Diabetes Maternal Grandmother   . Heart disease Maternal Grandfather 77  . Diabetes Cousin    Allergies  Allergen Reactions  . Codeine     REACTION: could not stay alert   Current Outpatient Prescriptions on File Prior to Visit  Medication Sig Dispense Refill  . amoxicillin (AMOXIL) 500 MG capsule TAKE ONE CAPSULE BY MOUTH TWICE A DAY AS NEEDED FOR MOUTH ULCER 20 capsule 4  . amoxicillin-clavulanate (AUGMENTIN) 875-125 MG tablet Take 1 tablet by mouth 2 (two) times daily. 20 tablet 0  . Azelastine HCl 0.15 % SOLN Place 1-2 sprays into the nose 2 (two) times daily. As needed for allergy symptoms 30 mL 11  . bismuth subsalicylate (PEPTO  BISMOL) 262 MG/15ML suspension Take 15 mLs by mouth as needed. Reported on 01/26/2016    . cetirizine (ZYRTEC) 10 MG tablet Take 10 mg by mouth daily as needed for allergies.     . fluticasone (FLONASE) 50 MCG/ACT nasal spray Place 1 spray into both nostrils daily as needed for allergies.     Marland Kitchen ibuprofen (ADVIL,MOTRIN) 200 MG tablet Take 200 mg by mouth every 4 (four) hours as needed for moderate pain. OTC as directed    . lidocaine (XYLOCAINE) 2 % solution SWISH,GARGLE ANT SPIT 15 MILLILITERS 3-4 TIMES A DAY AS NEEDED FOR ULCERS 400 mL 0  . loperamide (IMODIUM) 2 MG capsule Take 2 mg by mouth daily as needed for diarrhea or loose stools. Reported on 01/26/2016     No current facility-administered medications on file prior to visit.    Review of Systems  Constitutional: Positive for fever, appetite change and fatigue.  HENT: Positive for  congestion, postnasal drip, rhinorrhea, sinus pressure, sneezing and sore throat. Negative for ear pain.   Eyes: Negative for pain and discharge.  Respiratory: Positive for cough. Negative for shortness of breath, wheezing and stridor.   Cardiovascular: Negative for chest pain.  Gastrointestinal: Negative for nausea, vomiting and diarrhea.  Genitourinary: Negative for urgency, frequency and hematuria.  Musculoskeletal: Positive for myalgias. Negative for arthralgias.       Pos for L arm pain that is improving  Skin: Negative for rash.  Neurological: Positive for headaches. Negative for dizziness, weakness and light-headedness.  Psychiatric/Behavioral: Negative for confusion and dysphoric mood.       Objective:   Physical Exam  Constitutional: She appears well-developed and well-nourished. No distress.  Frail appearing and fatigued   HENT:  Head: Normocephalic and atraumatic.  Right Ear: External ear normal.  Left Ear: External ear normal.  Mouth/Throat: Oropharynx is clear and moist.  Nares are injected and congested  No sinus tenderness Clear rhinorrhea and post nasal drip   Eyes: Conjunctivae and EOM are normal. Pupils are equal, round, and reactive to light. Right eye exhibits no discharge. Left eye exhibits no discharge.  Neck: Normal range of motion. Neck supple.  Cardiovascular: Normal rate and normal heart sounds.   Pulmonary/Chest: Effort normal and breath sounds normal. No respiratory distress. She has no wheezes. She has no rales. She exhibits no tenderness.  Harsh bs Few scattered rhonchi  No wheeze    Musculoskeletal: She exhibits no edema.  Lymphadenopathy:    She has no cervical adenopathy.  Neurological: She is alert.  Skin: Skin is warm and dry. No rash noted.  Psychiatric: She has a normal mood and affect.          Assessment & Plan:   Problem List Items Addressed This Visit      Respiratory   Acute bronchitis    In pt who has had exposure in  hospital setting to healthcare based illness Cover with levaquin (also purulent sputum) Reassuring exam  For bronchitis -take levaquin as directed  Drink lots of fluids Try tessalon for cough  For night time cough- you can try the tramadol from [facility]  mucinex DM is also helpful for cough and congestion  For fever - tylenol or ibuprofen  Update if not starting to improve in a week or if worsening         Other Visit Diagnoses    Fever, unspecified    -  Primary    Relevant Orders    POC Influenza A&B(BINAX/QUICKVUE) (Completed)

## 2016-02-21 NOTE — Assessment & Plan Note (Signed)
Reviewed notes from [facility] to be a metastasizing leiomyoma  Gyn also follows  Obs for now

## 2016-02-21 NOTE — Assessment & Plan Note (Signed)
In pt who has had exposure in hospital setting to healthcare based illness Cover with levaquin (also purulent sputum) Reassuring exam  For bronchitis -take levaquin as directed  Drink lots of fluids Try tessalon for cough  For night time cough- you can try the tramadol from [facility]  mucinex DM is also helpful for cough and congestion  For fever - tylenol or ibuprofen  Update if not starting to improve in a week or if worsening

## 2016-02-21 NOTE — Progress Notes (Signed)
Pre visit review using our clinic review tool, if applicable. No additional management support is needed unless otherwise documented below in the visit note. 

## 2016-02-21 NOTE — Patient Instructions (Signed)
For bronchitis -take levaquin as directed  Drink lots of fluids Try tessalon for cough  For night time cough- you can try the tramadol from [facility]  mucinex DM is also helpful for cough and congestion  For fever - tylenol or ibuprofen   Get rest  Don't go to work until temperature is down and you feel better

## 2016-02-22 ENCOUNTER — Ambulatory Visit: Payer: No Typology Code available for payment source | Admitting: Family Medicine

## 2016-02-28 ENCOUNTER — Encounter: Payer: Self-pay | Admitting: Cardiovascular Disease

## 2016-03-06 ENCOUNTER — Ambulatory Visit: Payer: No Typology Code available for payment source | Admitting: Cardiovascular Disease

## 2016-06-12 ENCOUNTER — Other Ambulatory Visit: Payer: Self-pay | Admitting: Family Medicine

## 2016-06-12 NOTE — Telephone Encounter (Signed)
done

## 2016-06-12 NOTE — Telephone Encounter (Signed)
Please refill times 1 

## 2016-06-12 NOTE — Telephone Encounter (Signed)
Refill request , please advise

## 2016-06-13 MED ORDER — PREDNISONE 5 MG/5ML PO SOLN: ORAL | Status: DC

## 2016-06-13 NOTE — Telephone Encounter (Signed)
Brooke Morrow with [facility]-- She wanted to verify quantity for recent refill of magic mouthwash... She states the Rx was sent in for only 64mL---previous Rx was for 458mL---please verify if okay to change to 449mL

## 2016-06-13 NOTE — Telephone Encounter (Signed)
I already changed Rx instructions to reflect 488mL and asked pharmacy to d/c previous Rx in note to pharmacy

## 2016-06-13 NOTE — Addendum Note (Signed)
Addended by: Lurlean Nanny on: 06/13/2016 09:27 AM   Modules accepted: Orders

## 2016-10-02 ENCOUNTER — Encounter: Payer: Self-pay | Admitting: Gynecology

## 2016-10-12 ENCOUNTER — Ambulatory Visit (INDEPENDENT_AMBULATORY_CARE_PROVIDER_SITE_OTHER): Payer: No Typology Code available for payment source | Admitting: Family Medicine

## 2016-10-12 ENCOUNTER — Encounter: Payer: Self-pay | Admitting: Family Medicine

## 2016-10-12 VITALS — BP 100/80 | HR 64 | Temp 97.4°F | Wt 131.0 lb

## 2016-10-12 DIAGNOSIS — H6991 Unspecified Eustachian tube disorder, right ear: Secondary | ICD-10-CM

## 2016-10-12 DIAGNOSIS — J302 Other seasonal allergic rhinitis: Secondary | ICD-10-CM

## 2016-10-12 MED ORDER — PREDNISONE 10 MG PO TABS: 10.0000 mg | ORAL_TABLET | Freq: Every day | ORAL | 0 refills | Status: DC

## 2016-10-12 MED ORDER — AZELASTINE HCL 0.15 % NA SOLN: 1.0000 | Freq: Two times a day (BID) | NASAL | 11 refills | Status: DC

## 2016-10-12 NOTE — Progress Notes (Signed)
Subjective:    Patient ID: Brooke Morrow, female    DOB: 1956/10/17, 60 y.o.   MRN: [id number]  HPI This is a 60 yo female who presents today with ear fullness, sinus pressure and headache for 5 days.Pain in temples, r>l. Taking tylenol without relief. Has history of recurrent sinus and ear infections. No fever, no cough, no wheeze. Currently taking Zyrtec, has recently restarted Azelastine nasal spray, no taking flonase. Has used prednisone in the past, prefers not to take, can't remember why, just that she did not like.   Past Medical History:  Diagnosis Date  . Benign metastasizing leiomyoma of uterus ([place]) 07/2015  . Heart murmur    faint  . RMSF [facility] spotted fever)    Past Surgical History:  Procedure Laterality Date  . CESAREAN SECTION    . LUNG SURGERY  07/2015  . TONSILLECTOMY    . VAGINAL HYSTERECTOMY  2007   Posterior colporrhaphy for leiomyoma and rectocele   Family History  Problem Relation Age of Onset  . Heart disease Mother 63    bypass and stents   . Hypertension Mother   . Heart disease Maternal Aunt   . Diabetes Maternal Aunt   . Heart disease Maternal Uncle   . Diabetes Maternal Uncle   . Heart disease Maternal Grandmother     pacemaker, stents  . Diabetes Maternal Grandmother   . Heart disease Maternal Grandfather 18  . Diabetes Cousin    Social History  Substance Use Topics  . Smoking status: Former Smoker    Quit date: 02/27/2006  . Smokeless tobacco: Never Used  . Alcohol use 0.0 oz/week     Comment: occasional wine      Review of Systems Per HPI    Objective:   Physical Exam  Constitutional: She is oriented to person, place, and time. She appears well-developed and well-nourished.  HENT:  Head: Normocephalic and atraumatic.  Right Ear: External ear and ear canal normal. Tympanic membrane is retracted.  Left Ear: External ear and ear canal normal. Tympanic membrane is scarred.  Nose: Mucosal edema and rhinorrhea  present. Right sinus exhibits maxillary sinus tenderness and frontal sinus tenderness. Left sinus exhibits no maxillary sinus tenderness and no frontal sinus tenderness.  Mouth/Throat: Uvula is midline. Posterior oropharyngeal erythema present.  Eyes: Conjunctivae are normal.  Neck: Normal range of motion. Neck supple.  Cardiovascular: Normal rate, regular rhythm and normal heart sounds.   Pulmonary/Chest: Effort normal and breath sounds normal.  Lymphadenopathy:    She has no cervical adenopathy.  Neurological: She is alert and oriented to person, place, and time.  Skin: Skin is warm and dry.  Psychiatric: She has a normal mood and affect. Her behavior is normal. Judgment and thought content normal.  Vitals reviewed.     BP 100/80 (BP Location: Right Arm, Patient Position: Sitting, Cuff Size: Normal)   Pulse 64   Temp 97.4 F (36.3 C) (Oral)   Wt 131 lb (59.4 kg)   SpO2 98%   BMI 21.47 kg/m  Wt Readings from Last 3 Encounters:  10/12/16 131 lb (59.4 kg)  02/21/16 122 lb 12 oz (55.7 kg)  01/26/16 126 lb (57.2 kg)       Assessment & Plan:  1. Chronic seasonal allergic rhinitis, unspecified trigger - continue zyrtec and Azelastine, add flonase and BID Sudafed - Azelastine HCl 0.15 % SOLN; Place 1-2 sprays into the nose 2 (two) times daily. As needed for allergy symptoms  Dispense: 30  mL; Refill: 11 - predniSONE (DELTASONE) 10 MG tablet; Take 1 tablet (10 mg total) by mouth daily with breakfast.  Dispense: 5 tablet; Refill: 0 - she prefers not to take prednisone, but provided a written prescription that she may use if not better in 3-4 days  2. Disorder of right eustachian tube - TM does not appear infected - see #1 - predniSONE (DELTASONE) 10 MG tablet; Take 1 tablet (10 mg total) by mouth daily with breakfast.  Dispense: 5 tablet; Refill: 0   Clarene Reamer, FNP-BC  [facility] [place] [facility]  10/12/2016 11:39 AM

## 2016-10-12 NOTE — Patient Instructions (Addendum)
Take sudafed one in the morning and one after lunch Continue Atrovent, add Flonase If not better in 3-4 days, start prednisone   Barotitis Media Barotitis media is inflammation of your middle ear. This occurs when the auditory tube (eustachian tube) leading from the back of your nose (nasopharynx) to your eardrum is blocked. This blockage may result from a cold, environmental allergies, or an upper respiratory infection. Unresolved barotitis media may lead to damage or hearing loss (barotrauma), which may become permanent. HOME CARE INSTRUCTIONS   Use medicines as recommended by your health care provider. Over-the-counter medicines will help unblock the canal and can help during times of air travel.  Do not put anything into your ears to clean or unplug them. Eardrops will not be helpful.  Do not swim, dive, or fly until your health care provider says it is all right to do so. If these activities are necessary, chewing gum with frequent, forceful swallowing may help. It is also helpful to hold your nose and gently blow to pop your ears for equalizing pressure changes. This forces air into the eustachian tube.  Only take over-the-counter or prescription medicines for pain, discomfort, or fever as directed by your health care provider.  A decongestant may be helpful in decongesting the middle ear and make pressure equalization easier. SEEK MEDICAL CARE IF:  You experience a serious form of dizziness in which you feel as if the room is spinning and you feel nauseated (vertigo).  Your symptoms only involve one ear. SEEK IMMEDIATE MEDICAL CARE IF:   You develop a severe headache, dizziness, or severe ear pain.  You have bloody or pus-like drainage from your ears.  You develop a fever.  Your problems do not improve or become worse. MAKE SURE YOU:   Understand these instructions.  Will watch your condition.  Will get help right away if you are not doing well or get worse.   This  information is not intended to replace advice given to you by your health care provider. Make sure you discuss any questions you have with your health care provider.   Document Released: 11/16/2000 Document Revised: 09/09/2013 Document Reviewed: 06/16/2013 Elsevier Interactive Patient Education [employer].

## 2016-10-12 NOTE — Progress Notes (Signed)
Pre visit review using our clinic review tool, if applicable. No additional management support is needed unless otherwise documented below in the visit note. 

## 2016-11-12 ENCOUNTER — Ambulatory Visit (INDEPENDENT_AMBULATORY_CARE_PROVIDER_SITE_OTHER): Payer: No Typology Code available for payment source | Admitting: Internal Medicine

## 2016-11-12 ENCOUNTER — Encounter: Payer: Self-pay | Admitting: Family Medicine

## 2016-11-12 ENCOUNTER — Encounter: Payer: Self-pay | Admitting: Internal Medicine

## 2016-11-12 VITALS — BP 104/78 | HR 80 | Temp 97.9°F | Wt 131.0 lb

## 2016-11-12 DIAGNOSIS — J029 Acute pharyngitis, unspecified: Secondary | ICD-10-CM

## 2016-11-12 DIAGNOSIS — J301 Allergic rhinitis due to pollen: Secondary | ICD-10-CM

## 2016-11-12 NOTE — Patient Instructions (Signed)
Sore Throat When you have a sore throat, your throat may:  Hurt.  Burn.  Feel irritated.  Feel scratchy. Many things can cause a sore throat, including:  An infection.  Allergies.  Dryness in the air.  Smoke or pollution.  Gastroesophageal reflux disease (GERD).  A tumor. A sore throat can be the first sign of another sickness. It can happen with other problems, like coughing or a fever. Most sore throats go away without treatment. Follow these instructions at home:  Take over-the-counter medicines only as told by your doctor.  Drink enough fluids to keep your pee (urine) clear or pale yellow.  Rest when you feel you need to.  To help with pain, try:  Sipping warm liquids, such as broth, herbal tea, or warm water.  Eating or drinking cold or frozen liquids, such as frozen ice pops.  Gargling with a salt-water mixture 3-4 times a day or as needed. To make a salt-water mixture, add -1 tsp of salt in 1 cup of warm water. Mix it until you cannot see the salt anymore.  Sucking on hard candy or throat lozenges.  Putting a cool-mist humidifier in your bedroom at night.  Sitting in the bathroom with the door closed for 5-10 minutes while you run hot water in the shower.  Do not use any tobacco products, such as cigarettes, chewing tobacco, and e-cigarettes. If you need help quitting, ask your doctor. Contact a doctor if:  You have a fever for more than 2-3 days.  You keep having symptoms for more than 2-3 days.  Your throat does not get better in 7 days.  You have a fever and your symptoms suddenly get worse. Get help right away if:  You have trouble breathing.  You cannot swallow fluids, soft foods, or your saliva.  You have swelling in your throat or neck that gets worse.  You keep feeling like you are going to throw up (vomit).  You keep throwing up. This information is not intended to replace advice given to you by your health care provider. Make sure  you discuss any questions you have with your health care provider. Document Released: 08/28/2008 Document Revised: 07/15/2016 Document Reviewed: 09/09/2015 Elsevier Interactive Patient Education  2017 Elsevier Inc.  

## 2016-11-12 NOTE — Telephone Encounter (Signed)
Pt scheduled appt with Webb Silversmith, NP for today

## 2016-11-12 NOTE — Progress Notes (Signed)
Subjective:    Patient ID: Brooke Morrow, female    DOB: 1956-03-22, 60 y.o.   MRN: [id number]  HPI  Pt presents to the clinic today with c/o ear pain, nasal congestion, sore throat and cough. This started 2-3 days ago. She reports that her throat is "on fire" and is painful to swallow. She is blowing yellow mucous out of her nose. She denies decreased hearing. The cough is non productive but she has had some mild shortness of breath. She denies chest pain. She denies fever, chills or body aches. She has tried Tylenol, Ibuprofen, Sudafed and cough medication without any relief. She has a history of seasonal allergies and was seen 1 month ago for the same, prescribed Prednisone but reports she never took it secondary to the bad side effects she read about. She is taking Zyrtec 2 x day. She has not had sick contacts that she is aware of.    Review of Systems      Past Medical History:  Diagnosis Date  . Benign metastasizing leiomyoma of uterus ([place]) 07/2015  . Heart murmur    faint  . RMSF [facility] spotted fever)     Current Outpatient Prescriptions  Medication Sig Dispense Refill  . amoxicillin (AMOXIL) 500 MG capsule TAKE ONE CAPSULE BY MOUTH TWICE A DAY AS NEEDED FOR MOUTH ULCER 20 capsule 4  . Azelastine HCl 0.15 % SOLN Place 1-2 sprays into the nose 2 (two) times daily. As needed for allergy symptoms 30 mL 11  . bismuth subsalicylate (PEPTO BISMOL) 262 MG/15ML suspension Take 15 mLs by mouth as needed. Reported on 01/26/2016    . cetirizine (ZYRTEC) 10 MG tablet Take 10 mg by mouth daily as needed for allergies.     . fluticasone (FLONASE) 50 MCG/ACT nasal spray Place 1 spray into both nostrils daily as needed for allergies.     Marland Kitchen ibuprofen (ADVIL,MOTRIN) 200 MG tablet Take 200 mg by mouth every 4 (four) hours as needed for moderate pain. OTC as directed    . lidocaine (XYLOCAINE) 2 % solution SWISH,GARGLE ANT SPIT 15 MILLILITERS 3-4 TIMES A DAY AS NEEDED FOR ULCERS 400  mL 0  . loperamide (IMODIUM) 2 MG capsule Take 2 mg by mouth daily as needed for diarrhea or loose stools. Reported on 01/26/2016    . predniSONE (DELTASONE) 10 MG tablet Take 1 tablet (10 mg total) by mouth daily with breakfast. 5 tablet 0   No current facility-administered medications for this visit.     Allergies  Allergen Reactions  . Codeine     REACTION: could not stay alert    Family History  Problem Relation Age of Onset  . Heart disease Mother 16    bypass and stents   . Hypertension Mother   . Heart disease Maternal Aunt   . Diabetes Maternal Aunt   . Heart disease Maternal Uncle   . Diabetes Maternal Uncle   . Heart disease Maternal Grandmother     pacemaker, stents  . Diabetes Maternal Grandmother   . Heart disease Maternal Grandfather 57  . Diabetes Cousin     Social History   Social History  . Marital status: Married    Spouse name: N/A  . Number of children: 2  . Years of education: N/A   Occupational History  . [employer]   Social History Main Topics  . Smoking status: Former Smoker    Quit date: 02/27/2006  . Smokeless tobacco: Never  Used  . Alcohol use 0.0 oz/week     Comment: occasional wine  . Drug use: No  . Sexual activity: Yes    Birth control/ protection: Surgical     Comment: HYST-1st intercourse 34 yo-1 partner   Other Topics Concern  . Not on file   Social History Narrative  . No narrative on file     Constitutional: Denies fever, malaise, fatigue, headache or abrupt weight changes.  HEENT: Pt reports ear pain, nasal congestion and sore throat. Denies eye pain, eye redness, ringing in the ears, wax buildup, runny nose, bloody nose. Respiratory: Pt cough, reports shortness of breath. Denies difficulty breathing or sputum production.   Cardiovascular: Denies chest pain, chest tightness, palpitations or swelling in the hands or feet.   No other specific complaints in a complete review of systems (except as listed  in HPI above).  Objective:   Physical Exam   BP 104/78   Pulse 80   Temp 97.9 F (36.6 C) (Oral)   Wt 131 lb (59.4 kg)   SpO2 99%   BMI 21.47 kg/m  Wt Readings from Last 3 Encounters:  11/12/16 131 lb (59.4 kg)  10/12/16 131 lb (59.4 kg)  02/21/16 122 lb 12 oz (55.7 kg)    General: Appears her stated age, in NAD. HEENT: Head: normal shape and size, no sinus tenderness; Ears: Tm's gray and intact, normal light reflex, + serous effusion bilaterally; Nose: mucosa pink and moist, septum midline; Throat/Mouth: Teeth present, mucosa pink and moist, + PND, no exudate, lesions or ulcerations noted.  Neck:  No adenopathy noted. Cardiovascular: Normal rate and rhythm. S1,S2 noted.    BMET    Component Value Date/Time   NA 137 07/08/2015 1244   K 4.5 07/08/2015 1244   CL 103 07/08/2015 1244   CO2 29 07/08/2015 1244   GLUCOSE 85 07/08/2015 1244   BUN 10 07/08/2015 1244   CREATININE 0.68 07/08/2015 1244   CREATININE 0.77 10/27/2013 1225   CALCIUM 9.5 07/08/2015 1244   GFRNONAA >60 06/23/2015 0356   GFRAA >60 06/23/2015 0356    Lipid Panel     Component Value Date/Time   CHOL 198 10/27/2013 1225   TRIG 85 10/27/2013 1225   HDL 65 10/27/2013 1225   CHOLHDL 3.0 10/27/2013 1225   VLDL 17 10/27/2013 1225   LDLCALC 116 (H) 10/27/2013 1225    CBC    Component Value Date/Time   WBC 3.9 (L) 07/08/2015 1244   RBC 4.32 07/08/2015 1244   HGB 13.0 07/08/2015 1244   HCT 38.7 07/08/2015 1244   PLT 316.0 07/08/2015 1244   MCV 89.6 07/08/2015 1244   MCH 29.4 06/23/2015 0356   MCHC 33.5 07/08/2015 1244   RDW 14.0 07/08/2015 1244   LYMPHSABS 1.6 07/08/2015 1244   MONOABS 0.2 07/08/2015 1244   EOSABS 0.1 07/08/2015 1244   BASOSABS 0.0 07/08/2015 1244    Hgb A1C Lab Results  Component Value Date   HGBA1C 5.3 08/27/2012           Assessment & Plan:   Allergic Rhinitis:  Sore throat seems to be her most concerning symptom RST: negative 80 mg Depo IM today Continue  Zyrtec May want to add in [place] OTC for cough  RTC as needed or if symptoms persist or worsen Webb Silversmith, NP

## 2016-11-13 ENCOUNTER — Telehealth: Payer: Self-pay | Admitting: Family Medicine

## 2016-11-13 NOTE — Telephone Encounter (Signed)
Pt sent my chart message wanting to get hep c labs and flu shot.  Is it ok to schedule labs?

## 2016-11-13 NOTE — Telephone Encounter (Signed)
Stated she didn't want hep c   She stated she didn't sent my chart message

## 2016-11-13 NOTE — Telephone Encounter (Signed)
That is fine  thanks

## 2016-11-15 LAB — POCT RAPID STREP A (OFFICE): RAPID STREP A SCREEN: NEGATIVE

## 2016-11-15 MED ORDER — METHYLPREDNISOLONE ACETATE 80 MG/ML IJ SUSP
80.0000 mg | Freq: Once | INTRAMUSCULAR | Status: AC
  Administered 2016-11-12: 80 mg via INTRAMUSCULAR

## 2016-11-15 NOTE — Addendum Note (Signed)
Addended by: Lurlean Nanny on: 11/15/2016 09:44 AM   Modules accepted: Orders

## 2016-11-16 ENCOUNTER — Telehealth: Payer: Self-pay

## 2016-11-16 MED ORDER — AMOXICILLIN-POT CLAVULANATE 875-125 MG PO TABS: 1.0000 | ORAL_TABLET | Freq: Two times a day (BID) | ORAL | 0 refills | Status: DC

## 2016-11-16 NOTE — Telephone Encounter (Signed)
Pt notified Rx sent and to f/u if no improvement  °

## 2016-11-16 NOTE — Telephone Encounter (Signed)
Pt said her throat is not as sore as before she thinks the shot helped the inflammation in throat, but she does have nasal discharge that is gray, brown and yellow, she is coughing a lot at night (productive), pt is not aware of any fever but she has gotten chills at night, she also has facial pain and pressure and right ear fullness, pt has taken sudafed, a cough med, ibuprofen, flonase  and zyrtec with no relief, if you think she needs abx please send to [facility]

## 2016-11-16 NOTE — Telephone Encounter (Signed)
I sent augmentin to [facility] F/u if no improvement

## 2016-11-16 NOTE — Telephone Encounter (Signed)
Pt left v/m; pt was seen 11/12/16 and was given cortisone shot; pt has been out of work all week and pt does not feel she is recovering as she should;pt request Dr Glori Bickers to review chart and suggest what she thinks pt needs to do.[facility].

## 2016-11-16 NOTE — Telephone Encounter (Signed)
She may need an antibiotic if not improving  How is the sore throat? Any fever? , colored nasal d/c? Sinus pain? Productive cough?  Let me know

## 2016-12-06 ENCOUNTER — Encounter: Payer: Self-pay | Admitting: Family Medicine

## 2017-01-21 ENCOUNTER — Other Ambulatory Visit: Payer: Self-pay | Admitting: Gynecology

## 2017-01-21 DIAGNOSIS — Z1231 Encounter for screening mammogram for malignant neoplasm of breast: Secondary | ICD-10-CM

## 2017-01-30 ENCOUNTER — Ambulatory Visit (INDEPENDENT_AMBULATORY_CARE_PROVIDER_SITE_OTHER): Payer: No Typology Code available for payment source | Admitting: Gynecology

## 2017-01-30 ENCOUNTER — Encounter: Payer: Self-pay | Admitting: Gynecology

## 2017-01-30 VITALS — BP 118/74 | Ht 65.0 in | Wt 134.0 lb

## 2017-01-30 DIAGNOSIS — C55 Malignant neoplasm of uterus, part unspecified: Secondary | ICD-10-CM

## 2017-01-30 DIAGNOSIS — Z01411 Encounter for gynecological examination (general) (routine) with abnormal findings: Secondary | ICD-10-CM | POA: Diagnosis not present

## 2017-01-30 DIAGNOSIS — N952 Postmenopausal atrophic vaginitis: Secondary | ICD-10-CM

## 2017-01-30 DIAGNOSIS — D259 Leiomyoma of uterus, unspecified: Secondary | ICD-10-CM

## 2017-01-30 NOTE — Patient Instructions (Signed)

## 2017-01-30 NOTE — Progress Notes (Signed)
    Brooke Morrow 17-May-1956 [id number]        61 y.o.  [id number] for annual exam.   Past medical history,surgical history, problem list, medications, allergies, family history and social history were all reviewed and documented as reviewed in the EPIC chart.  ROS:  Performed with pertinent positives and negatives included in the history, assessment and plan.   Additional significant findings :  None   Exam: [name] assistant Vitals:   01/30/17 0842  BP: 118/74  Weight: 134 lb (60.8 kg)  Height: 5\' 5"  (1.651 m)   Body mass index is 22.3 kg/m.  General appearance:  Normal affect, orientation and appearance. Skin: Grossly normal HEENT: Without gross lesions.  No cervical or supraclavicular adenopathy. Thyroid normal.  Lungs:  Clear without wheezing, rales or rhonchi Cardiac: RR, without RMG Abdominal:  Soft, nontender, without masses, guarding, rebound, organomegaly or hernia Breasts:  Examined lying and sitting without masses, retractions, discharge or axillary adenopathy. Pelvic:  Ext, BUS, Vagina: With atrophic changes  Adnexa: Without masses or tenderness    Anus and perineum: Normal   Rectovaginal: Normal sphincter tone without palpated masses or tenderness.    Assessment/Plan:  61 y.o. [id number] female for annual exam.   1. History of benign metastasizing leiomyoma. Actively being followed at [facility]. 2. Postmenopausal/atrophic genital changes. Status post TVH posterior colporrhaphy 2007 for leiomyoma and rectocele. Doing well without significant hot flushes, night sweats or vaginal dryness. 3. Mammography due now and I reminded patient to follow up for this and she has it scheduled. SBE monthly reviewed. 4. Pap smear 2017. No Pap smear done today. No history of abnormal Pap smears. Options to stop screening altogether per current screening guidelines reviewed based on hysterectomy history. Will readdress on an annual basis. 5. DEXA 2017 normal. Plan repeat DEXA at 5  year interval. 6. Colonoscopy 2012. Repeat at their recommended interval. 7. Health maintenance. No routine lab work done as patient does this elsewhere. Follow up 1 year, sooner as needed.   [name] MD, 9:01 AM 01/30/2017

## 2017-02-14 ENCOUNTER — Ambulatory Visit
Admission: RE | Admit: 2017-02-14 | Discharge: 2017-02-14 | Disposition: A | Discharge status: Home / Self Care | Payer: No Typology Code available for payment source | Source: Ambulatory Visit | Attending: Gynecology | Admitting: Gynecology

## 2017-02-14 ENCOUNTER — Encounter: Payer: Self-pay | Admitting: Family Medicine

## 2017-02-14 DIAGNOSIS — Z1231 Encounter for screening mammogram for malignant neoplasm of breast: Secondary | ICD-10-CM | POA: Diagnosis not present

## 2017-02-15 ENCOUNTER — Other Ambulatory Visit: Payer: Self-pay | Admitting: Gynecology

## 2017-02-15 ENCOUNTER — Ambulatory Visit (INDEPENDENT_AMBULATORY_CARE_PROVIDER_SITE_OTHER): Payer: 59 | Admitting: Primary Care

## 2017-02-15 ENCOUNTER — Encounter: Payer: Self-pay | Admitting: Primary Care

## 2017-02-15 VITALS — BP 120/84 | HR 67 | Temp 97.5°F | Ht 65.0 in | Wt 134.8 lb

## 2017-02-15 DIAGNOSIS — J019 Acute sinusitis, unspecified: Secondary | ICD-10-CM

## 2017-02-15 DIAGNOSIS — R928 Other abnormal and inconclusive findings on diagnostic imaging of breast: Secondary | ICD-10-CM

## 2017-02-15 MED ORDER — AMOXICILLIN-POT CLAVULANATE 875-125 MG PO TABS: 1.0000 | ORAL_TABLET | Freq: Two times a day (BID) | ORAL | 0 refills | Status: DC

## 2017-02-15 NOTE — Progress Notes (Signed)
Subjective:    Patient ID: Brooke Morrow, female    DOB: 1956-04-09, 61 y.o.   MRN: [id number]  HPI  [name] is a 61 year old female with a history of chronic allergic rhinitis, sinusitis who presents today with a chief complaint of sinus pressure. She also reports nasal congestion, headaches, fatigued, sore throat. Her symptoms began 10 days ago.She's been taking Zyrtec, Sudafed, and prescription strength nasal spray without much improvement. Overall her symptoms are much worse. She's blowing yellow mucous from her nasal cavity. Her cough is non productive. She's feeling more fatigued.  She was last treated for URI symptoms with Augmentin course in December 2017.  Review of Systems  Constitutional: Positive for fatigue. Negative for fever.  HENT: Positive for congestion, sinus pain, sinus pressure and sore throat. Negative for ear pain.   Respiratory: Positive for cough. Negative for shortness of breath.   Cardiovascular: Negative for chest pain.       Past Medical History:  Diagnosis Date  . Benign metastasizing leiomyoma of uterus ([place]) 07/2015  . Heart murmur    faint  . RMSF [facility] spotted fever)      Social History   Social History  . Marital status: Married    Spouse name: N/A  . Number of children: 2  . Years of education: N/A   Occupational History  . [employer]   Social History Main Topics  . Smoking status: Former Smoker    Quit date: 02/27/2006  . Smokeless tobacco: Never Used  . Alcohol use 0.0 oz/week     Comment: occasional wine  . Drug use: No  . Sexual activity: Yes    Birth control/ protection: Surgical     Comment: HYST-1st intercourse 52 yo-1 partner   Other Topics Concern  . Not on file   Social History Narrative  . No narrative on file    Past Surgical History:  Procedure Laterality Date  . CESAREAN SECTION    . LUNG SURGERY  07/2015  . TONSILLECTOMY    . VAGINAL HYSTERECTOMY  2007   Posterior  colporrhaphy for leiomyoma and rectocele    Family History  Problem Relation Age of Onset  . Heart disease Mother 29    bypass and stents   . Hypertension Mother   . Heart disease Maternal Aunt   . Diabetes Maternal Aunt   . Heart disease Maternal Uncle   . Diabetes Maternal Uncle   . Heart disease Maternal Grandmother     pacemaker, stents  . Diabetes Maternal Grandmother   . Heart disease Maternal Grandfather 40  . Diabetes Cousin     Allergies  Allergen Reactions  . Codeine     REACTION: could not stay alert    Current Outpatient Prescriptions on File Prior to Visit  Medication Sig Dispense Refill  . Azelastine HCl 0.15 % SOLN Place 1-2 sprays into the nose 2 (two) times daily. As needed for allergy symptoms 30 mL 11  . bismuth subsalicylate (PEPTO BISMOL) 262 MG/15ML suspension Take 15 mLs by mouth as needed. Reported on 01/26/2016    . cetirizine (ZYRTEC) 10 MG tablet Take 10 mg by mouth daily as needed for allergies.     . fluticasone (FLONASE) 50 MCG/ACT nasal spray Place 1 spray into both nostrils daily as needed for allergies.     Marland Kitchen ibuprofen (ADVIL,MOTRIN) 200 MG tablet Take 200 mg by mouth every 4 (four) hours as needed for moderate pain. OTC as  directed    . loperamide (IMODIUM) 2 MG capsule Take 2 mg by mouth daily as needed for diarrhea or loose stools. Reported on 01/26/2016    . lidocaine (XYLOCAINE) 2 % solution SWISH,GARGLE ANT SPIT 15 MILLILITERS 3-4 TIMES A DAY AS NEEDED FOR ULCERS (Patient not taking: Reported on 01/30/2017) 400 mL 0   No current facility-administered medications on file prior to visit.     BP 120/84   Pulse 67   Temp 97.5 F (36.4 C) (Oral)   Ht 5\' 5"  (1.651 m)   Wt 134 lb 12.8 oz (61.1 kg)   SpO2 97%   BMI 22.43 kg/m    Objective:   Physical Exam  Constitutional: She appears well-nourished. She appears ill.  HENT:  Right Ear: Tympanic membrane and ear canal normal.  Left Ear: Tympanic membrane and ear canal normal.  Nose:  Mucosal edema present. Right sinus exhibits maxillary sinus tenderness and frontal sinus tenderness. Left sinus exhibits maxillary sinus tenderness and frontal sinus tenderness.  Mouth/Throat: Oropharynx is clear and moist.  Eyes: Conjunctivae are normal.  Neck: Neck supple.  Cardiovascular: Normal rate and regular rhythm.   Pulmonary/Chest: Effort normal and breath sounds normal. She has no wheezes. She has no rales.  Lymphadenopathy:    She has no cervical adenopathy.  Skin: Skin is warm and dry.          Assessment & Plan:  Acute Sinusitis:  Symptoms for 10 days. Feels like prior sinusitis. Exam today consistent for sinusitis, could very well be early bacterial involvement.  Given duration of symptoms coupled with examination, will treat. Rx for Augmentin course sent to pharmacy.  Continue nasal spray, Zyrtec, fluids. Follow up PRN.  Did have discussion today regarding chance of resistance and other side effects with recurrent antibiotic treatment.   Sheral Flow, NP

## 2017-02-15 NOTE — Progress Notes (Signed)
Pre visit review using our clinic review tool, if applicable. No additional management support is needed unless otherwise documented below in the visit note. 

## 2017-02-15 NOTE — Patient Instructions (Signed)
Start Augmentin antibiotics. Take 1 tablet by mouth twice daily for 10 days.  Continue the prescription strength nasal spray and Zyrtec.  Ensure you are staying hydrated and rest.  It was a pleasure meeting you!

## 2017-02-22 ENCOUNTER — Ambulatory Visit
Admission: RE | Admit: 2017-02-22 | Discharge: 2017-02-22 | Disposition: A | Discharge status: Home / Self Care | Payer: 59 | Source: Ambulatory Visit | Attending: Gynecology | Admitting: Gynecology

## 2017-02-22 DIAGNOSIS — R928 Other abnormal and inconclusive findings on diagnostic imaging of breast: Secondary | ICD-10-CM

## 2017-02-22 DIAGNOSIS — N6012 Diffuse cystic mastopathy of left breast: Secondary | ICD-10-CM | POA: Diagnosis not present

## 2017-07-01 ENCOUNTER — Ambulatory Visit (INDEPENDENT_AMBULATORY_CARE_PROVIDER_SITE_OTHER): Payer: 59 | Admitting: Family Medicine

## 2017-07-01 ENCOUNTER — Encounter: Payer: Self-pay | Admitting: Family Medicine

## 2017-07-01 VITALS — BP 110/80 | HR 61 | Temp 98.2°F | Ht 66.0 in | Wt 133.8 lb

## 2017-07-01 DIAGNOSIS — J019 Acute sinusitis, unspecified: Secondary | ICD-10-CM | POA: Insufficient documentation

## 2017-07-01 DIAGNOSIS — K120 Recurrent oral aphthae: Secondary | ICD-10-CM

## 2017-07-01 DIAGNOSIS — J0100 Acute maxillary sinusitis, unspecified: Secondary | ICD-10-CM

## 2017-07-01 MED ORDER — AMOXICILLIN-POT CLAVULANATE 875-125 MG PO TABS: 1.0000 | ORAL_TABLET | Freq: Two times a day (BID) | ORAL | 0 refills | Status: DC

## 2017-07-01 NOTE — Assessment & Plan Note (Signed)
Recurrent  Respond to amox (? Why)- from prev doctor  Will tx with augmentin for sinusitis If no imp will call for refill of lidocane solun

## 2017-07-01 NOTE — Progress Notes (Signed)
Subjective:    Patient ID: Brooke Morrow, female    DOB: 1956/01/12, 61 y.o.   MRN: [id number]  HPI Here with mouth ulcers and fatigue for 2 days  Not feeling well  Headache (L face)  Mouth ulcers  Tired   Pain over L face and ear - ? Sinus infection  Has been in the [place] feels crusty and congested  No yellow or green d/c  No ST (just the ulcers)   Mild cough /not prod   A little diarrhea last wed / got better (pepto) No nausea (some belching)   Gets occ tick bites - few on abdomen (not big and no rash)  No bullseye rash or fever     Taking ibuprofen -back is achey  No fever   Has been traveling and working and run down     [employer] from Last 3 Encounters:  07/01/17 133 lb 12.8 oz (60.7 kg)  02/15/17 134 lb 12.8 oz (61.1 kg)  01/30/17 134 lb (60.8 kg)   21.60 kg/m   Hx of mouth ulcers  Dr Linus Salmons gave her amox for those - only thing that worked  She has not started it again  Also lidocaine rinse prn    Patient Active Problem List   Diagnosis Date Noted  . Acute sinusitis 07/01/2017  . 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 Fibroid   . Stress reaction 03/04/2012  . 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 Tick bite 03/02/2011  . 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 HEMOCCULT POSITIVE STOOL 10/22/2009   Past Medical History:  Diagnosis Date  . Benign metastasizing leiomyoma of uterus ([place]) 07/2015  . Heart murmur    faint  . RMSF [facility] spotted fever)    Past Surgical History:  Procedure Laterality Date  . CESAREAN SECTION    . LUNG SURGERY  07/2015  . TONSILLECTOMY    . VAGINAL HYSTERECTOMY  2007   Posterior colporrhaphy for leiomyoma and rectocele   Social History  Substance Use Topics  . Smoking status: Former Smoker    Quit date: 02/27/2006  . Smokeless tobacco: Never Used  . Alcohol use 0.0 oz/week     Comment: occasional wine   Family History  Problem Relation Age of Onset  . Heart disease Mother  95       bypass and stents   . Hypertension Mother   . Heart disease Maternal Aunt   . Diabetes Maternal Aunt   . Heart disease Maternal Uncle   . Diabetes Maternal Uncle   . Heart disease Maternal Grandmother        pacemaker, stents  . Diabetes Maternal Grandmother   . Heart disease Maternal Grandfather 59  . Diabetes Cousin    Allergies  Allergen Reactions  . Codeine     REACTION: could not stay alert   Current Outpatient Prescriptions on File Prior to Visit  Medication Sig Dispense Refill  . Azelastine HCl 0.15 % SOLN Place 1-2 sprays into the nose 2 (two) times daily. As needed for allergy symptoms 30 mL 11  . bismuth subsalicylate (PEPTO BISMOL) 262 MG/15ML suspension Take 15 mLs by mouth as needed. Reported on 01/26/2016    . cetirizine (ZYRTEC) 10 MG tablet Take 10 mg by mouth daily as needed for allergies.     . fluticasone (FLONASE) 50 MCG/ACT nasal spray Place 1 spray into both nostrils daily as needed for allergies.     Marland Kitchen ibuprofen (  ADVIL,MOTRIN) 200 MG tablet Take 200 mg by mouth every 4 (four) hours as needed for moderate pain. OTC as directed    . loperamide (IMODIUM) 2 MG capsule Take 2 mg by mouth daily as needed for diarrhea or loose stools. Reported on 01/26/2016    . lidocaine (XYLOCAINE) 2 % solution SWISH,GARGLE ANT SPIT 15 MILLILITERS 3-4 TIMES A DAY AS NEEDED FOR ULCERS (Patient not taking: Reported on 01/30/2017) 400 mL 0   No current facility-administered medications on file prior to visit.     Review of Systems  Constitutional: Positive for appetite change. Negative for fatigue and fever.  HENT: Positive for congestion, ear pain, postnasal drip, rhinorrhea, sinus pressure and sore throat. Negative for nosebleeds.        Pos for mouth ulcers she often gets when sick  Eyes: Negative for pain, redness and itching.  Respiratory: Positive for cough. Negative for shortness of breath and wheezing.   Cardiovascular: Negative for chest pain.  Gastrointestinal:  Negative for abdominal pain, diarrhea, nausea and vomiting.  Endocrine: Negative for polyuria.  Genitourinary: Negative for dysuria, frequency and urgency.  Musculoskeletal: Negative for arthralgias and myalgias.  Allergic/Immunologic: Negative for immunocompromised state.  Neurological: Positive for headaches. Negative for dizziness, tremors, syncope, weakness and numbness.  Hematological: Negative for adenopathy. Does not bruise/bleed easily.  Psychiatric/Behavioral: Negative for dysphoric mood. The patient is not nervous/anxious.        Objective:   Physical Exam  Constitutional: She appears well-developed and well-nourished. No distress.  Well appearing  HENT:  Head: Normocephalic and atraumatic.  Right Ear: External ear normal.  Left Ear: External ear normal.  Mouth/Throat: Oropharynx is clear and moist. No oropharyngeal exudate.  Nares are injected and congested  Bilateral maxillary and ethmoid sinus tenderness  Post nasal drip  Mouth-scattered small  ulcers incl on R side of tongue  Eyes: Pupils are equal, round, and reactive to light. Conjunctivae and EOM are normal. Right eye exhibits no discharge. Left eye exhibits no discharge.  Neck: Normal range of motion. Neck supple.  Cardiovascular: Normal rate and regular rhythm.   Pulmonary/Chest: Effort normal and breath sounds normal. No respiratory distress. She has no wheezes. She has no rales.  Lymphadenopathy:    She has no cervical adenopathy.  Neurological: She is alert. No cranial nerve deficit.  Skin: Skin is warm and dry. No rash noted. No pallor.  Psychiatric: She has a normal mood and affect.          Assessment & Plan:   Problem List Items Addressed This Visit      Respiratory   Acute sinusitis    With uri  Cover with augmentin  Disc symptomatic care - see instructions on AVS  Update if not starting to improve in a week or if worsening   Recommend nasal saline rinse       Relevant Medications    amoxicillin-clavulanate (AUGMENTIN) 875-125 MG tablet     Digestive   APHTHOUS ULCERS    Recurrent  Respond to amox (? Why)- from prev doctor  Will tx with augmentin for sinusitis If no imp will call for refill of lidocane solun

## 2017-07-01 NOTE — Patient Instructions (Signed)
I think you have a sinus infection  You may also have a respiratory virus  Drink lots of fluids and rest when you can  Take the augmentin as directed  Try nasal saline spray as needed over the counter also   Update if not starting to improve in a week or if worsening

## 2017-07-01 NOTE — Assessment & Plan Note (Signed)
With uri  Cover with augmentin  Disc symptomatic care - see instructions on AVS  Update if not starting to improve in a week or if worsening   Recommend nasal saline rinse

## 2017-07-09 ENCOUNTER — Telehealth: Payer: Self-pay | Admitting: Family Medicine

## 2017-07-09 NOTE — Telephone Encounter (Signed)
The normal they use is 15mg /68ml and that's the amount in the [facility] Magic mouth wash so that's what they will use.

## 2017-07-09 NOTE — Telephone Encounter (Signed)
Refill sent to pharmacy as instructed. 

## 2017-07-09 NOTE — Telephone Encounter (Signed)
Brooke Morrow at [facility] request cb with what dosage of prednisone is to be used in the mouthwash compound.

## 2017-07-09 NOTE — Telephone Encounter (Signed)
Not on med list, last OV was an acute appt on 07/01/17, please advise

## 2017-07-09 NOTE — Telephone Encounter (Signed)
She uses it for mouth ulcers  Please refill times one

## 2017-07-09 NOTE — Telephone Encounter (Signed)
What ever is in Dukes -- does 60 mg hydrocortisone make sense? Thanks

## 2017-08-22 DIAGNOSIS — R918 Other nonspecific abnormal finding of lung field: Secondary | ICD-10-CM | POA: Diagnosis not present

## 2017-08-23 ENCOUNTER — Encounter: Payer: Self-pay | Admitting: Internal Medicine

## 2017-08-23 ENCOUNTER — Ambulatory Visit (INDEPENDENT_AMBULATORY_CARE_PROVIDER_SITE_OTHER): Payer: 59 | Admitting: Internal Medicine

## 2017-08-23 VITALS — BP 110/76 | HR 68 | Temp 97.8°F | Wt 135.5 lb

## 2017-08-23 DIAGNOSIS — J301 Allergic rhinitis due to pollen: Secondary | ICD-10-CM

## 2017-08-23 DIAGNOSIS — S00411A Abrasion of right ear, initial encounter: Secondary | ICD-10-CM | POA: Diagnosis not present

## 2017-08-23 NOTE — Patient Instructions (Signed)
Allergic Rhinitis Allergic rhinitis is when the mucous membranes in the nose respond to allergens. Allergens are particles in the air that cause your body to have an allergic reaction. This causes you to release allergic antibodies. Through a chain of events, these eventually cause you to release histamine into the blood stream. Although meant to protect the body, it is this release of histamine that causes your discomfort, such as frequent sneezing, congestion, and an itchy, runny nose. What are the causes? Seasonal allergic rhinitis (hay fever) is caused by pollen allergens that may come from grasses, trees, and weeds. Year-round allergic rhinitis (perennial allergic rhinitis) is caused by allergens such as house dust mites, pet dander, and mold spores. What are the signs or symptoms?  Nasal stuffiness (congestion).  Itchy, runny nose with sneezing and tearing of the eyes. How is this diagnosed? Your health care provider can help you determine the allergen or allergens that trigger your symptoms. If you and your health care provider are unable to determine the allergen, skin or blood testing may be used. Your health care provider will diagnose your condition after taking your health history and performing a physical exam. Your health care provider may assess you for other related conditions, such as asthma, pink eye, or an ear infection. How is this treated? Allergic rhinitis does not have a cure, but it can be controlled by:  Medicines that block allergy symptoms. These may include allergy shots, nasal sprays, and oral antihistamines.  Avoiding the allergen. Hay fever may often be treated with antihistamines in pill or nasal spray forms. Antihistamines block the effects of histamine. There are over-the-counter medicines that may help with nasal congestion and swelling around the eyes. Check with your health care provider before taking or giving this medicine. If avoiding the allergen or the  medicine prescribed do not work, there are many new medicines your health care provider can prescribe. Stronger medicine may be used if initial measures are ineffective. Desensitizing injections can be used if medicine and avoidance does not work. Desensitization is when a patient is given ongoing shots until the body becomes less sensitive to the allergen. Make sure you follow up with your health care provider if problems continue. Follow these instructions at home: It is not possible to completely avoid allergens, but you can reduce your symptoms by taking steps to limit your exposure to them. It helps to know exactly what you are allergic to so that you can avoid your specific triggers. Contact a health care provider if:  You have a fever.  You develop a cough that does not stop easily (persistent).  You have shortness of breath.  You start wheezing.  Symptoms interfere with normal daily activities. This information is not intended to replace advice given to you by your health care provider. Make sure you discuss any questions you have with your health care provider. Document Released: 08/14/2001 Document Revised: 07/20/2016 Document Reviewed: 07/27/2013 Elsevier Interactive Patient Education  2017 Elsevier Inc.  

## 2017-08-23 NOTE — Progress Notes (Signed)
Subjective:    Patient ID: Brooke Morrow, female    DOB: 19-Mar-1956, 61 y.o.   MRN: [id number]  HPI  Pt presents to the clinic today with c/o right ear discomfort and a constant buzzing noise. She reports she woke up this morning with this. She reports associated runny nose, nasal congestion and scratchy throat. She denies fever, chills or body aches. She reports she has not put anything in her ear. She is taking an OTC antihistamine and nasal Azelastine with minimal relief.  Review of Systems  Past Medical History:  Diagnosis Date  . Benign metastasizing leiomyoma of uterus ([place]) 07/2015  . Heart murmur    faint  . RMSF [facility] spotted fever)     Current Outpatient Prescriptions  Medication Sig Dispense Refill  . Azelastine HCl 0.15 % SOLN Place 1-2 sprays into the nose 2 (two) times daily. As needed for allergy symptoms 30 mL 11  . bismuth subsalicylate (PEPTO BISMOL) 262 MG/15ML suspension Take 15 mLs by mouth as needed. Reported on 01/26/2016    . cetirizine (ZYRTEC) 10 MG tablet Take 10 mg by mouth daily as needed for allergies.     . fluticasone (FLONASE) 50 MCG/ACT nasal spray Place 1 spray into both nostrils daily as needed for allergies.     Marland Kitchen ibuprofen (ADVIL,MOTRIN) 200 MG tablet Take 200 mg by mouth every 4 (four) hours as needed for moderate pain. OTC as directed    . lidocaine (XYLOCAINE) 2 % solution SWISH,GARGLE ANT SPIT 15 MILLILITERS 3-4 TIMES A DAY AS NEEDED FOR ULCERS 400 mL 0  . loperamide (IMODIUM) 2 MG capsule Take 2 mg by mouth daily as needed for diarrhea or loose stools. Reported on 01/26/2016    . Mouthwash Compounding Base LIQD SWISH , GARGLE, AND SPIT 15ML BY MOUTH 3 TO 4 TIMES A DAY AS NEEDED FOR ULCERS 400 each 0   No current facility-administered medications for this visit.     Allergies  Allergen Reactions  . Codeine     REACTION: could not stay alert    Family History  Problem Relation Age of Onset  . Heart disease Mother 54     bypass and stents   . Hypertension Mother   . Heart disease Maternal Aunt   . Diabetes Maternal Aunt   . Heart disease Maternal Uncle   . Diabetes Maternal Uncle   . Heart disease Maternal Grandmother        pacemaker, stents  . Diabetes Maternal Grandmother   . Heart disease Maternal Grandfather 53  . Diabetes Cousin     Social History   Social History  . Marital status: Married    Spouse name: N/A  . Number of children: 2  . Years of education: N/A   Occupational History  . [employer]   Social History Main Topics  . Smoking status: Former Smoker    Quit date: 02/27/2006  . Smokeless tobacco: Never Used  . Alcohol use 0.0 oz/week     Comment: occasional wine  . Drug use: No  . Sexual activity: Yes    Birth control/ protection: Surgical     Comment: HYST-1st intercourse 36 yo-1 partner   Other Topics Concern  . Not on file   Social History Narrative  . No narrative on file     Constitutional: Denies fever, malaise, fatigue, headache or abrupt weight changes.  HEENT: Pt reports ringing in her ear, runny nose, nasal congestion, sore throat. Denies  eye pain, eye redness, ear pain, wax buildup,bloody nose Respiratory: Denies difficulty breathing, shortness of breath, cough or sputum production.     No other specific complaints in a complete review of systems (except as listed in HPI above).     Objective:   Physical Exam   BP 110/76   Pulse 68   Temp 97.8 F (36.6 C) (Oral)   Wt 135 lb 8 oz (61.5 kg)   SpO2 99%   BMI 21.87 kg/m  Wt Readings from Last 3 Encounters:  08/23/17 135 lb 8 oz (61.5 kg)  07/01/17 133 lb 12.8 oz (60.7 kg)  02/15/17 134 lb 12.8 oz (61.1 kg)    General: Appears her stated age, well developed, well nourished in NAD. HEENT: Head: normal shape and size, mild maxillary sinus tenderness noted; Right Ear: Tm's gray and intact, normal light reflex, old blood noted at 3 [place] in the ear  canal;  Throat/Mouth: Teeth  present, mucosa pink and moist, no exudate, lesions or ulcerations noted.  Neck:  No adenopathy noted.Marland Kitchen   BMET    Component Value Date/Time   NA 137 07/08/2015 1244   K 4.5 07/08/2015 1244   CL 103 07/08/2015 1244   CO2 29 07/08/2015 1244   GLUCOSE 85 07/08/2015 1244   BUN 10 07/08/2015 1244   CREATININE 0.68 07/08/2015 1244   CREATININE 0.77 10/27/2013 1225   CALCIUM 9.5 07/08/2015 1244   GFRNONAA >60 06/23/2015 0356   GFRAA >60 06/23/2015 0356    Lipid Panel     Component Value Date/Time   CHOL 198 10/27/2013 1225   TRIG 85 10/27/2013 1225   HDL 65 10/27/2013 1225   CHOLHDL 3.0 10/27/2013 1225   VLDL 17 10/27/2013 1225   LDLCALC 116 (H) 10/27/2013 1225    CBC    Component Value Date/Time   WBC 3.9 (L) 07/08/2015 1244   RBC 4.32 07/08/2015 1244   HGB 13.0 07/08/2015 1244   HCT 38.7 07/08/2015 1244   PLT 316.0 07/08/2015 1244   MCV 89.6 07/08/2015 1244   MCH 29.4 06/23/2015 0356   MCHC 33.5 07/08/2015 1244   RDW 14.0 07/08/2015 1244   LYMPHSABS 1.6 07/08/2015 1244   MONOABS 0.2 07/08/2015 1244   EOSABS 0.1 07/08/2015 1244   BASOSABS 0.0 07/08/2015 1244    Hgb A1C Lab Results  Component Value Date   HGBA1C 5.3 08/27/2012           Assessment & Plan:   Abrasion of External Ear:  No treatment needed Should heal itself Advised her to avoid putting anything in her ear  Allergic Rhinitis:  Use Flonase instead of Azestaine Continue antihistamine OTC  Return precautions discussed Webb Silversmith, NP

## 2017-08-26 ENCOUNTER — Encounter: Payer: Self-pay | Admitting: Family Medicine

## 2017-08-26 ENCOUNTER — Ambulatory Visit: Payer: 59 | Admitting: Family Medicine

## 2017-08-29 ENCOUNTER — Other Ambulatory Visit: Payer: Self-pay | Admitting: Family Medicine

## 2017-08-29 NOTE — Telephone Encounter (Signed)
Please refill times one  

## 2017-08-29 NOTE — Telephone Encounter (Signed)
Not on med list and Rx says it's for mouth ulcers but this Rx hasn't been prescribed in a while, please advise

## 2017-10-23 DIAGNOSIS — Z23 Encounter for immunization: Secondary | ICD-10-CM | POA: Diagnosis not present

## 2018-02-21 ENCOUNTER — Other Ambulatory Visit: Payer: Self-pay | Admitting: Family Medicine

## 2018-02-22 MED ORDER — AMOXICILLIN 500 MG PO CAPS: ORAL_CAPSULE | ORAL | 0 refills | Status: DC

## 2018-02-22 NOTE — Telephone Encounter (Signed)
Refilled  She takes for mouth ulcers prn

## 2018-03-04 DIAGNOSIS — I8311 Varicose veins of right lower extremity with inflammation: Secondary | ICD-10-CM | POA: Diagnosis not present

## 2018-03-04 DIAGNOSIS — I8312 Varicose veins of left lower extremity with inflammation: Secondary | ICD-10-CM | POA: Diagnosis not present

## 2018-03-31 DIAGNOSIS — I83813 Varicose veins of bilateral lower extremities with pain: Secondary | ICD-10-CM | POA: Diagnosis not present

## 2018-08-07 ENCOUNTER — Encounter: Payer: Self-pay | Admitting: Family Medicine

## 2018-08-07 ENCOUNTER — Telehealth: Payer: Self-pay | Admitting: Family Medicine

## 2018-08-07 MED ORDER — MOUTHWASH COMPOUNDING BASE PO LIQD: 15.0000 mL | Freq: Three times a day (TID) | ORAL | 1 refills | Status: DC | PRN

## 2018-08-07 NOTE — Telephone Encounter (Signed)
Mouthwash Compounding Base refill Last Refill:01/04/16 # 400 ml Last OV: ? PCP: Dr Glori Bickers Pharmacy: [facility] [address] [place]  Does this prescription need the lidocaine 2% solution also?

## 2018-08-07 NOTE — Telephone Encounter (Signed)
Brooke Morrow w/[facility] [phone] would like for someone clinical to return her call to explain this mouthwash prescription.

## 2018-08-07 NOTE — Telephone Encounter (Signed)
I sent it  ?Thanks ?

## 2018-08-07 NOTE — Telephone Encounter (Signed)
Can you call them please?-Latoiya Maradiaga V Calysta Craigo, RMA

## 2018-08-07 NOTE — Telephone Encounter (Signed)
Called pharmacy and cleared up her question. The will fill med now

## 2018-08-07 NOTE — Telephone Encounter (Signed)
Spoke with patient and appointment made for follow up on 09/03/18.  Pharmacy requested Predn/nysta[facility]lidoc[facility] And that was her last Rx too-Oluwatobi Ruppe V Venesha Petraitis, RMA

## 2018-08-07 NOTE — Telephone Encounter (Signed)
Copied from [place] [id number]. Topic: Quick Communication - Rx Refill/Question >> Aug 07, 2018  9:42 AM Gardiner Ramus wrote: Medication: [facility] LIQD [[id number]]   Has the patient contacted their pharmacy?no Preferred Pharmacy (with phone number or street name):[id number] [place], [place] [phone] (Phone) [phone] (Fax)  Agent: Please be advised that RX refills may take up to 3 business days. We ask that you follow-up with your pharmacy.

## 2018-08-07 NOTE — Telephone Encounter (Signed)
Please schedule fall f/u  Please ask her if she also wants the lidocaine (I assume this is for recurrent mouth ulcers) Thanks

## 2018-09-03 ENCOUNTER — Ambulatory Visit: Payer: 59 | Admitting: Family Medicine

## 2018-09-15 ENCOUNTER — Ambulatory Visit: Payer: 59 | Admitting: Family Medicine

## 2018-09-15 ENCOUNTER — Encounter: Payer: Self-pay | Admitting: Family Medicine

## 2018-09-15 VITALS — BP 126/76 | HR 65 | Temp 98.1°F | Ht 65.0 in | Wt 136.0 lb

## 2018-09-15 DIAGNOSIS — Z13220 Encounter for screening for lipoid disorders: Secondary | ICD-10-CM

## 2018-09-15 DIAGNOSIS — K120 Recurrent oral aphthae: Secondary | ICD-10-CM

## 2018-09-15 DIAGNOSIS — Z23 Encounter for immunization: Secondary | ICD-10-CM | POA: Diagnosis not present

## 2018-09-15 DIAGNOSIS — Z1231 Encounter for screening mammogram for malignant neoplasm of breast: Secondary | ICD-10-CM | POA: Diagnosis not present

## 2018-09-15 DIAGNOSIS — R918 Other nonspecific abnormal finding of lung field: Secondary | ICD-10-CM

## 2018-09-15 DIAGNOSIS — C55 Malignant neoplasm of uterus, part unspecified: Secondary | ICD-10-CM

## 2018-09-15 DIAGNOSIS — D259 Leiomyoma of uterus, unspecified: Secondary | ICD-10-CM | POA: Insufficient documentation

## 2018-09-15 DIAGNOSIS — Z131 Encounter for screening for diabetes mellitus: Secondary | ICD-10-CM | POA: Diagnosis not present

## 2018-09-15 LAB — COMPREHENSIVE METABOLIC PANEL
ALT: 14 U/L (ref 0–35)
AST: 16 U/L (ref 0–37)
Albumin: 4.1 g/dL (ref 3.5–5.2)
Alkaline Phosphatase: 57 U/L (ref 39–117)
BUN: 21 mg/dL (ref 6–23)
CALCIUM: 9 mg/dL (ref 8.4–10.5)
CHLORIDE: 104 meq/L (ref 96–112)
CO2: 30 meq/L (ref 19–32)
CREATININE: 0.74 mg/dL (ref 0.40–1.20)
GFR: 85.08 mL/min (ref >60.00)
Glucose, Bld: 87 mg/dL (ref 70–99)
Potassium: 3.7 mEq/L (ref 3.5–5.1)
SODIUM: 138 meq/L (ref 135–145)
Total Bilirubin: 0.7 mg/dL (ref 0.2–1.2)
Total Protein: 6.8 g/dL (ref 6.0–8.3)

## 2018-09-15 LAB — LIPID PANEL
CHOL/HDL RATIO: 3
Cholesterol: 211 mg/dL — ABNORMAL HIGH (ref 0–200)
HDL: 79.6 mg/dL (ref >39.00)
LDL CALC: 120 mg/dL — AB (ref 0–99)
NonHDL: 131.52
TRIGLYCERIDES: 57 mg/dL (ref 0.0–149.0)
VLDL: 11.4 mg/dL (ref 0.0–40.0)

## 2018-09-15 NOTE — Assessment & Plan Note (Signed)
Doing well with gyn f/u  Met to lung

## 2018-09-15 NOTE — Assessment & Plan Note (Signed)
b9 metastasizing leiomyoma  Sees gyn for this  Doing well

## 2018-09-15 NOTE — Assessment & Plan Note (Signed)
Mammogram ordered Pt has breast exam with gyn-upcoming  No changes in self exam

## 2018-09-15 NOTE — Assessment & Plan Note (Signed)
CMET today with glucose Good health habits

## 2018-09-15 NOTE — Patient Instructions (Addendum)
We will set up your mammogram when you check out   Also labs today (cmet and lipid)- for diabetes screen/ cholesterol screen   If you need hepatitis A or B vaccine- we have it if you want to come back   Keep taking good care of yourself

## 2018-09-15 NOTE — Progress Notes (Signed)
Subjective:    Patient ID: Brooke Morrow, female    DOB: May 01, 1956, 62 y.o.   MRN: [id number]  HPI Here for f/u of chronic medical problems   Wt Readings from Last 3 Encounters:  09/15/18 136 lb (61.7 kg)  08/23/17 135 lb 8 oz (61.5 kg)  07/01/17 133 lb 12.8 oz (60.7 kg)  stable 22.63 kg/m   Had a flu shot today    She takes prn amoxicillin for aphthous ulcers Was prev px and the only thing that works  Has not needed frequently   Doing ok overall  Has been working / Pensions consultant working a church  Is a go go go lifestyle  Still exercising  Taking good care of herself   Has leiomyoma -b9 metastasizing  Has gyn appt next month  Doing well with that    Last mammogram-goes to the [facility] center 3/18  Needs that scheduled  Self breast exam -no lumps   She is having more hot flashes  Tolerating them  Not sleeping well through the night   Would like screening for cholesterol and glucose   Travels internationally ([place])  Needs a hep A or B vaccine-not sure which  Patient Active Problem List   Diagnosis Date Noted  . Screening mammogram, encounter for 09/15/2018  . Lipid screening 09/15/2018  . Diabetes mellitus screening 09/15/2018  . 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 Fibroid   . Stress reaction 03/04/2012  . 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 HEMOCCULT POSITIVE STOOL 10/22/2009   Past Medical History:  Diagnosis Date  . Benign metastasizing leiomyoma of uterus ([place]) 07/2015  . Heart murmur    faint  . RMSF [facility] spotted fever)    Past Surgical History:  Procedure Laterality Date  . CESAREAN SECTION    . LUNG SURGERY  07/2015  . TONSILLECTOMY    . VAGINAL HYSTERECTOMY  2007   Posterior colporrhaphy for leiomyoma and rectocele   Social History   Tobacco Use  . Smoking status: Former Smoker    Last attempt to quit: 02/27/2006    Years since quitting: 12.5  . Smokeless tobacco:  Never Used  Substance Use Topics  . Alcohol use: Yes    Alcohol/week: 0.0 standard drinks    Comment: occasional wine  . Drug use: No   Family History  Problem Relation Age of Onset  . Heart disease Mother 42       bypass and stents   . Hypertension Mother   . Heart disease Maternal Aunt   . Diabetes Maternal Aunt   . Heart disease Maternal Uncle   . Diabetes Maternal Uncle   . Heart disease Maternal Grandmother        pacemaker, stents  . Diabetes Maternal Grandmother   . Heart disease Maternal Grandfather 57  . Diabetes Cousin    Allergies  Allergen Reactions  . Codeine     REACTION: could not stay alert   Current Outpatient Medications on File Prior to Visit  Medication Sig Dispense Refill  . amoxicillin (AMOXIL) 500 MG capsule Take one capsule bid prn for mouth ulcers 20 capsule 0  . Azelastine HCl 0.15 % SOLN Place 1-2 sprays into the nose 2 (two) times daily. As needed for allergy symptoms 30 mL 11  . bismuth subsalicylate (PEPTO BISMOL) 262 MG/15ML suspension Take 15 mLs by mouth as needed. Reported on 01/26/2016    . cetirizine (ZYRTEC) 10 MG  tablet Take 10 mg by mouth daily as needed for allergies.     . fluticasone (FLONASE) 50 MCG/ACT nasal spray Place 1 spray into both nostrils daily as needed for allergies.     Marland Kitchen ibuprofen (ADVIL,MOTRIN) 200 MG tablet Take 200 mg by mouth every 4 (four) hours as needed for moderate pain. OTC as directed    . loperamide (IMODIUM) 2 MG capsule Take 2 mg by mouth daily as needed for diarrhea or loose stools. Reported on 01/26/2016    . Mouthwash Compounding Base LIQD Take 15 mLs by mouth 3 (three) times daily as needed (swish and spit for mouth ulcers). 400 each 1   No current facility-administered medications on file prior to visit.     Review of Systems  Constitutional: Negative for activity change, appetite change, fatigue, fever and unexpected weight change.  HENT: Positive for mouth sores. Negative for congestion, ear pain,  rhinorrhea, sinus pressure and sore throat.        Mouth sores come and go  Eyes: Negative for pain, redness and visual disturbance.  Respiratory: Negative for cough, shortness of breath and wheezing.   Cardiovascular: Negative for chest pain and palpitations.  Gastrointestinal: Negative for abdominal pain, blood in stool, constipation and diarrhea.  Endocrine: Negative for polydipsia and polyuria.  Genitourinary: Negative for dysuria, frequency and urgency.  Musculoskeletal: Negative for arthralgias, back pain and myalgias.  Skin: Negative for pallor and rash.  Allergic/Immunologic: Negative for environmental allergies.  Neurological: Negative for dizziness, syncope and headaches.  Hematological: Negative for adenopathy. Does not bruise/bleed easily.  Psychiatric/Behavioral: Negative for decreased concentration and dysphoric mood. The patient is not nervous/anxious.        Objective:   Physical Exam  Constitutional: She appears well-developed and well-nourished. No distress.  Well appearing   HENT:  Head: Normocephalic and atraumatic.  Mouth/Throat: Oropharynx is clear and moist.  No mouth ulcers today  Eyes: Pupils are equal, round, and reactive to light. Conjunctivae and EOM are normal.  Neck: Normal range of motion. Neck supple. No JVD present. Carotid bruit is not present. No thyromegaly present.  Cardiovascular: Normal rate, regular rhythm, normal heart sounds and intact distal pulses. Exam reveals no gallop.  Pulmonary/Chest: Effort normal and breath sounds normal. No respiratory distress. She has no wheezes. She has no rales.  No crackles  Abdominal: Soft. Bowel sounds are normal. She exhibits no distension, no abdominal bruit and no mass. There is no tenderness.  Musculoskeletal: She exhibits no edema.  Lymphadenopathy:    She has no cervical adenopathy.  Neurological: She is alert. She has normal reflexes. She displays normal reflexes. No cranial nerve deficit.  Coordination normal.  Skin: Skin is warm and dry. No rash noted. No erythema.  Psychiatric: She has a normal mood and affect.  Pleasant           Assessment & Plan:   Problem List Items Addressed This Visit      Digestive   APHTHOUS ULCERS - Primary    Not currently active  Treated with amoxicillin oddly (only thing that works) also compounded mouthwash /lidocaine          Other   Diabetes mellitus screening    CMET today with glucose Good health habits       Relevant Orders   Comprehensive metabolic panel (Completed)   Lipid screening    Lipid panel today  Disc goals for lipids and reasons to control them Rev last labs with pt Rev low sat fat  diet in detail  Good diet/habits       Relevant Orders   Comprehensive metabolic panel (Completed)   Lipid panel (Completed)   right lower lobe pulmonary mass measures 1.8 x 2.2 cm    b9 metastasizing leiomyoma  Sees gyn for this  Doing well      Screening mammogram, encounter for    Mammogram ordered Pt has breast exam with gyn-upcoming  No changes in self exam       Relevant Orders   MM 3D SCREEN BREAST BILATERAL    Other Visit Diagnoses    Need for influenza vaccination       Relevant Orders   Flu Vaccine QUAD 6+ mos PF IM (Fluarix Quad PF) (Completed)

## 2018-09-15 NOTE — Assessment & Plan Note (Signed)
Not currently active  Treated with amoxicillin oddly (only thing that works) also compounded mouthwash /lidocaine

## 2018-09-15 NOTE — Assessment & Plan Note (Signed)
Lipid panel today  Disc goals for lipids and reasons to control them Rev last labs with pt Rev low sat fat diet in detail  Good diet/habits

## 2018-10-01 DIAGNOSIS — C55 Malignant neoplasm of uterus, part unspecified: Secondary | ICD-10-CM | POA: Diagnosis not present

## 2018-10-01 DIAGNOSIS — R918 Other nonspecific abnormal finding of lung field: Secondary | ICD-10-CM | POA: Diagnosis not present

## 2019-02-16 ENCOUNTER — Encounter: Payer: Self-pay | Admitting: Family Medicine

## 2019-02-17 MED ORDER — AMOXICILLIN 500 MG PO CAPS: ORAL_CAPSULE | ORAL | 0 refills | Status: DC

## 2019-02-17 MED ORDER — MOUTHWASH COMPOUNDING BASE PO LIQD: 15.0000 mL | Freq: Three times a day (TID) | ORAL | 1 refills | Status: DC | PRN

## 2019-08-17 ENCOUNTER — Telehealth: Payer: Self-pay

## 2019-08-17 NOTE — Telephone Encounter (Signed)
Left VM requesting pt to call the office back 

## 2019-08-17 NOTE — Telephone Encounter (Signed)
Do wear a mask Keep her son isolated in one room of the house until he gets a neg result  Wipe surfaces Hand washing  She can get tested at any time as well

## 2019-08-17 NOTE — Telephone Encounter (Signed)
Pt left v/m;pts son flew home on 08/15/19 from school in [place] and today pt has low grade fever, cough and S/T. pts son has been in her home with no mask but mostly social distancing. Pt pt wants to know what she should do other than wearing a mask and social distancing until her son gets his results back. pts son is on his way to [facility] now for covid testing. Pt request cb.

## 2019-08-20 NOTE — Telephone Encounter (Signed)
Called pt and someone answered and then hung up call

## 2019-08-21 NOTE — Telephone Encounter (Signed)
Called pt and no answer so will await a call back from pt

## 2019-09-01 ENCOUNTER — Encounter: Payer: Self-pay | Admitting: Family Medicine

## 2019-09-01 ENCOUNTER — Encounter: Payer: Self-pay | Admitting: Gynecology

## 2019-09-01 ENCOUNTER — Other Ambulatory Visit: Payer: Self-pay | Admitting: Family Medicine

## 2019-09-02 MED ORDER — AMOXICILLIN 500 MG PO CAPS: ORAL_CAPSULE | ORAL | 0 refills | Status: DC

## 2019-09-02 NOTE — Telephone Encounter (Signed)
See mychart request on this med

## 2019-09-28 ENCOUNTER — Encounter: Payer: Self-pay | Admitting: Family Medicine

## 2019-09-29 ENCOUNTER — Other Ambulatory Visit: Payer: Self-pay | Admitting: *Deleted

## 2019-09-29 DIAGNOSIS — Z20822 Contact with and (suspected) exposure to covid-19: Secondary | ICD-10-CM

## 2019-10-01 LAB — NOVEL CORONAVIRUS, NAA: SARS-CoV-2, NAA: NOT DETECTED

## 2019-10-15 ENCOUNTER — Encounter: Payer: Self-pay | Admitting: Family Medicine

## 2019-10-15 ENCOUNTER — Ambulatory Visit (INDEPENDENT_AMBULATORY_CARE_PROVIDER_SITE_OTHER): Payer: 59

## 2019-10-15 DIAGNOSIS — Z23 Encounter for immunization: Secondary | ICD-10-CM

## 2020-02-02 ENCOUNTER — Encounter: Payer: Self-pay | Admitting: Family Medicine

## 2020-03-17 ENCOUNTER — Ambulatory Visit: Payer: 59 | Attending: Internal Medicine

## 2020-03-17 DIAGNOSIS — Z20822 Contact with and (suspected) exposure to covid-19: Secondary | ICD-10-CM

## 2020-03-18 ENCOUNTER — Encounter: Payer: Self-pay | Admitting: Family Medicine

## 2020-03-18 LAB — NOVEL CORONAVIRUS, NAA: SARS-CoV-2, NAA: NOT DETECTED

## 2020-03-18 LAB — SARS-COV-2, NAA 2 DAY TAT

## 2020-03-24 ENCOUNTER — Ambulatory Visit: Payer: 59 | Attending: Internal Medicine

## 2020-03-24 DIAGNOSIS — Z20822 Contact with and (suspected) exposure to covid-19: Secondary | ICD-10-CM

## 2020-03-25 LAB — SARS-COV-2, NAA 2 DAY TAT

## 2020-03-25 LAB — NOVEL CORONAVIRUS, NAA: SARS-CoV-2, NAA: NOT DETECTED

## 2020-04-04 ENCOUNTER — Ambulatory Visit (INDEPENDENT_AMBULATORY_CARE_PROVIDER_SITE_OTHER): Payer: 59 | Admitting: Family Medicine

## 2020-04-04 ENCOUNTER — Telehealth: Payer: Self-pay

## 2020-04-04 ENCOUNTER — Encounter: Payer: Self-pay | Admitting: Family Medicine

## 2020-04-04 ENCOUNTER — Other Ambulatory Visit: Payer: Self-pay

## 2020-04-04 VITALS — BP 130/80 | HR 80 | Temp 97.7°F | Ht 65.0 in | Wt 141.0 lb

## 2020-04-04 DIAGNOSIS — R079 Chest pain, unspecified: Secondary | ICD-10-CM

## 2020-04-04 DIAGNOSIS — M25512 Pain in left shoulder: Secondary | ICD-10-CM | POA: Insufficient documentation

## 2020-04-04 MED ORDER — METHOCARBAMOL 500 MG PO TABS: 500.0000 mg | ORAL_TABLET | Freq: Three times a day (TID) | ORAL | 0 refills | Status: DC | PRN

## 2020-04-04 NOTE — Progress Notes (Signed)
Subjective:    Patient ID: Brooke Morrow, female    DOB: [birth year], 64 y.o.   MRN: [id number]  This visit occurred during the SARS-CoV-2 public health emergency.  Safety protocols were in place, including screening questions prior to the visit, additional usage of staff PPE, and extensive cleaning of exam room while observing appropriate contact time as indicated for disinfecting solutions.    HPI Pt presents with chest and arm pain and fatigue   Wt Readings from Last 3 Encounters:  04/04/20 141 lb (64 kg)  09/15/18 136 lb (61.7 kg)  08/23/17 135 lb 8 oz (61.5 kg)   23.46 kg/m     She went kayaking yesterday   Woke up with pain in L shoulder at 4:30 am so she took 3 ibuprofen (empty stomach but then ate something when she got up later)  Then it began to radiate to her L arm  Pain is electric feeling -lightning bolt   And then some burping /belching  Not feeling good in general-no energy  She drank plenty of fluids yesterday   There is a tender spot in her anterior shoulder   Not exertion  No sob  No pain in her legs  No rash anywhere  Feels generally anxious today   Has had some anxiety during covid in general     BP Readings from Last 3 Encounters:  04/04/20 (!) 160/90  09/15/18 126/76  08/23/17 110/76   Pulse Readings from Last 3 Encounters:  04/04/20 80  09/15/18 65  08/23/17 68   Pulse ox is 98% on RA today   Today EKG shows NSR with rate of 66 and no acute changes    Mother had heart dz in her 63s   Lab Results  Component Value Date   CHOL 211 (H) 09/15/2018   HDL 79.60 09/15/2018   LDLCALC 120 (H) 09/15/2018   TRIG 57.0 09/15/2018   CHOLHDL 3 09/15/2018    H/o benign metastasizing uterine leiomyoma  Last CT scan was in 2019 Results : Result Narrative  CT Chest without contrast  Indication: benign metastasizing leiomyoma, benign metastasizing leiomyoma, C55 Malignant neoplasm of uterus, part unspecified (CMS-HCC)  Compare:  August 22, 2017; Apr 04, 2016  Technique: Volumetric non-contrast chest CT acquisition was performed from the lower neck to the adrenal glands. 1.25 mm axial, 5 mm axial, 3 mm coronal, and 3 mm sagittal reconstructions were performed. 3D axial maximum intensity projection images (MIPS) were reconstructed to facilitate lung nodule detection.   Findings:  The central airways are patent. Multiple right lower lobe nodular opacities, the largest measuring up to 1 cm in length (series 5, image 63). While the largest nodule is not significantly changed from most recent comparison, it does appear enlarged compared to an older comparison from Apr 04, 2016. No new pulmonary nodules or masses. Stable surgical staples from prior right lower lobe wedge resection. No pneumothorax or pleural effusion.  The main pulmonary artery and thoracic aorta with its branches are normal in caliber and course. There are minimal atherosclerotic calcifications in the aorta and no calcifications in the coronary arteries. The heart is normal in size. There is no pericardial effusion. The neck base and thyroid are normal in appearance. The esophagus is normal in appearance. There is no abnormal lymphadenopathy in the thorax.   Limited noncontrast views of upper abdomen demonstrate hypodense foci within the liver, unchanged from prior. No acute osseous abnormalities.  Impression: Multiple right lower lobe nodular opacities, the largest  measuring up to 1 cm in length is stable in size from most recent prior. However, the largest right lower lobe nodule has enlarged compared to an older comparison from Apr 04, 2016. Follow up per clinical protocol.  Electronically Reviewed by: Joselyn Arrow, MD, [facility] Electronically Reviewed on: 10/01/2018 11:59 AM  I have reviewed the images and concur with the above findings.  Electronically Signed by: Neldon Mc, MD, [facility] Electronically Signed on:  10/01/2018 12:08 PM  Other Result Information  Interface, Rad Results In - 10/01/2018 12:09 PM EDT CT Chest without contrast  Indication: benign metastasizing leiomyoma, benign metastasizing leiomyoma, C55 Malignant neoplasm of uterus, part unspecified (CMS-HCC)  Compare: August 22, 2017; Apr 04, 2016  Technique:  Volumetric non-contrast chest CT acquisition was performed from the lower neck to the adrenal glands.  1.25 mm axial, 5 mm axial, 3 mm coronal, and 3 mm sagittal reconstructions were performed. 3D axial maximum intensity projection images (MIPS) were reconstructed to facilitate lung nodule detection.   Findings:  The central airways are patent. Multiple right lower lobe nodular opacities, the largest measuring up to 1 cm in length (series 5, image 63). While the largest nodule is not significantly changed from most recent comparison, it does appear enlarged compared to an older comparison from Apr 04, 2016. No new pulmonary nodules or masses. Stable surgical staples from prior right lower lobe wedge resection. No pneumothorax or pleural effusion.  The main pulmonary artery and thoracic aorta with its branches are normal in caliber and course. There are minimal atherosclerotic calcifications in the aorta and no calcifications in the coronary arteries. The heart is normal in size.  There is no pericardial effusion. The neck base and thyroid are normal in appearance. The esophagus is normal in appearance. There is no abnormal lymphadenopathy in the thorax.   Limited noncontrast views of upper abdomen demonstrate hypodense foci within the liver, unchanged from prior. No acute osseous abnormalities.  Impression: Multiple right lower lobe nodular opacities, the largest measuring up to 1 cm in length is stable in size from most recent prior. However, the largest right lower lobe nodule has enlarged compared to an older comparison from Apr 04, 2016. Follow up per clinical  protocol.   She did not have a scan last year due to covid Scan is planned very soon   Patient Active Problem List   Diagnosis Date Noted  . Left shoulder pain 04/04/2020  . Screening mammogram, encounter for 09/15/2018  . Lipid screening 09/15/2018  . Diabetes mellitus screening 09/15/2018  . 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  . 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 Fibroid   . Stress reaction 03/04/2012  . 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 HEMOCCULT POSITIVE STOOL 10/22/2009   Past Medical History:  Diagnosis Date  . Benign metastasizing leiomyoma of uterus 07/2015  . Heart murmur    faint  . [facility] spotted fever)    Past Surgical History:  Procedure Laterality Date  . CESAREAN SECTION    . LUNG SURGERY  07/2015  . TONSILLECTOMY    . VAGINAL HYSTERECTOMY  2007   Posterior colporrhaphy for leiomyoma and rectocele   Social History   Tobacco Use  . Smoking status: Former Smoker    Quit date: 02/27/2006    Years since quitting: 14.1  . Smokeless tobacco: Never Used  Substance Use Topics  . Alcohol use: Yes    Alcohol/week: 0.0  standard drinks    Comment: occasional wine  . Drug use: No   Family History  Problem Relation Age of Onset  . Heart disease Mother 55       bypass and stents   . Hypertension Mother   . Heart disease Maternal Aunt   . Diabetes Maternal Aunt   . Heart disease Maternal Uncle   . Diabetes Maternal Uncle   . Heart disease Maternal Grandmother        pacemaker, stents  . Diabetes Maternal Grandmother   . Heart disease Maternal Grandfather 83  . Diabetes Cousin    Allergies  Allergen Reactions  . Codeine     REACTION: could not stay alert   Current Outpatient Medications on File Prior to Visit  Medication Sig Dispense Refill  . amoxicillin (AMOXIL) 500 MG capsule Take one capsule bid prn for mouth ulcers 20 capsule 0  . Azelastine HCl 0.15 % SOLN Place 1-2  sprays into the nose 2 (two) times daily. As needed for allergy symptoms 30 mL 11  . bismuth subsalicylate (PEPTO BISMOL) 262 MG/15ML suspension Take 15 mLs by mouth as needed. Reported on 01/26/2016    . cetirizine (ZYRTEC) 10 MG tablet Take 10 mg by mouth daily as needed for allergies.     . fluticasone (FLONASE) 50 MCG/ACT nasal spray Place 1 spray into both nostrils daily as needed for allergies.     Marland Kitchen ibuprofen (ADVIL,MOTRIN) 200 MG tablet Take 200 mg by mouth every 4 (four) hours as needed for moderate pain. OTC as directed    . loperamide (IMODIUM) 2 MG capsule Take 2 mg by mouth daily as needed for diarrhea or loose stools. Reported on 01/26/2016    . Mouthwash Compounding Base LIQD Take 15 mLs by mouth 3 (three) times daily as needed (swish and spit for mouth ulcers). 400 each 1   No current facility-administered medications on file prior to visit.     Review of Systems  Constitutional: Positive for fatigue. Negative for activity change, appetite change, fever and unexpected weight change.  HENT: Negative for congestion, ear pain, rhinorrhea, sinus pressure and sore throat.   Eyes: Negative for pain, redness and visual disturbance.  Respiratory: Negative for cough, shortness of breath and wheezing.   Cardiovascular: Negative for chest pain and palpitations.  Gastrointestinal: Negative for abdominal pain, blood in stool, constipation and diarrhea.  Endocrine: Negative for polydipsia and polyuria.  Genitourinary: Negative for dysuria, frequency and urgency.  Musculoskeletal: Negative for arthralgias, back pain and myalgias.       Left shoulder and arm pain   Skin: Negative for pallor and rash.  Allergic/Immunologic: Negative for environmental allergies.  Neurological: Negative for dizziness, syncope and headaches.  Hematological: Negative for adenopathy. Does not bruise/bleed easily.  Psychiatric/Behavioral: Negative for decreased concentration and dysphoric mood. The patient is not  nervous/anxious.        Objective:   Physical Exam Constitutional:      General: She is not in acute distress.    Appearance: She is well-developed and normal weight. She is not ill-appearing or diaphoretic.  HENT:     Head: Normocephalic and atraumatic.     Mouth/Throat:     Mouth: Mucous membranes are moist.  Eyes:     General: No scleral icterus.    Pupils: Pupils are equal, round, and reactive to light.  Neck:     Vascular: No carotid bruit.     Comments: Tender over L trapezius  Cardiovascular:  Rate and Rhythm: Normal rate and regular rhythm.     Pulses: Normal pulses.     Heart sounds: Normal heart sounds. No murmur.  Pulmonary:     Effort: Pulmonary effort is normal. No respiratory distress.     Breath sounds: Normal breath sounds. No stridor. No wheezing or rales.  Abdominal:     General: Abdomen is flat. Bowel sounds are normal. There is no distension.     Palpations: Abdomen is soft. There is no mass.     Tenderness: There is no abdominal tenderness. There is no right CVA tenderness, left CVA tenderness, guarding or rebound.  Musculoskeletal:     Left shoulder: Tenderness and bony tenderness present. No swelling, deformity or crepitus. Normal range of motion. Normal strength. Normal pulse.     Cervical back: Normal range of motion and neck supple. Tenderness present. No rigidity.     Left lower leg: No edema.     Comments: L shoulder-acromion tenderness  Pain with full abduction  Neg hawking/neer tests  Nl int/ext rotation  Tender L trapezius/marked tenderness-especially posteriorly    Lymphadenopathy:     Cervical: No cervical adenopathy.  Skin:    General: Skin is warm and dry.     Coloration: Skin is not pale.     Findings: No erythema or rash.  Neurological:     Mental Status: She is alert. Mental status is at baseline.     Cranial Nerves: No cranial nerve deficit.     Sensory: No sensory deficit.     Motor: No weakness.     Deep Tendon Reflexes:  Reflexes normal.  Psychiatric:        Mood and Affect: Mood is anxious.        Cognition and Memory: Cognition normal.     Comments: Mildly anxious (admittedly) and fatigued  Pleasant and good historian           Assessment & Plan:   Problem List Items Addressed This Visit      Other   Left shoulder pain    L shoulder pain radiating to L arm s/p kayaking yesterday  Tender over acromion and also trapezius muscles  Overall reassuring exam and also EKG  Recommend ice for 10 minutes whenever she can  Gentle rom movements  Give px for methocarbamol for prn use/spasm  Update if not starting to improve in a week or if worsening  Also disc s/s of angina to watch for       Other Visit Diagnoses    Chest pain, unspecified type    -  Primary   Relevant Orders   EKG 12-Lead (Completed)

## 2020-04-04 NOTE — Patient Instructions (Addendum)
I think you have some irritated shoulder tendons and your trapezius muscle spasm (could be from kayaking) Use ice for 10 minute any chance you get  Ibuprofen as needed (always with food)   Try the methocarbamol for muscle spasm   Update if not starting to improve in a week or if worsening     If you develop chest /arm pressure worse with exertion or shortness of breath - let me know and get to the hospital

## 2020-04-04 NOTE — Telephone Encounter (Signed)
Pt walked in; with sharp on and off lt shoulder pain that started this morning around 5 AM.pt denies CP,SOB,H/A,was lightheaded earlier this morning but not now. Pain level now is 4. Pt does not have heart issues but as family hx of heart issues. Pt said she does not feel right, pt has had some dumbness in fingers, pt has fatigue and nausea this AM. No vomiting or other covid symptoms. Pt husband had covid but pt tested neg and has had both covid vaccines. BP 160/90  97.7 - 80 pulse ox 98%. Dr Glori Bickers had cancellation and will see pt now.

## 2020-04-04 NOTE — Assessment & Plan Note (Signed)
L shoulder pain radiating to L arm s/p kayaking yesterday  Tender over acromion and also trapezius muscles  Overall reassuring exam and also EKG  Recommend ice for 10 minutes whenever she can  Gentle rom movements  Give px for methocarbamol for prn use/spasm  Update if not starting to improve in a week or if worsening  Also disc s/s of angina to watch for

## 2020-04-04 NOTE — Telephone Encounter (Signed)
Brooke Morrow - Client TELEPHONE ADVICE RECORD [facility] Patient Name: Brooke Morrow Gender: Female DOB: Sep 06, 1956 Age: 64 Y 15 M 24 D Return Phone Number: [phone] (Primary) [place] [address] Client Brooke Morrow - Client Client Site [facility], Roque Lias - MD Contact Type Call Who Is Calling Patient / Member / Family / Caregiver Call Type Triage / Clinical Relationship To Patient Self Return Phone Number [phone] (Primary) Chief Complaint Arm Pain (no known cause) Reason for Call Request to Schedule Office Appointment Initial Comment Caller would like to scheduled an appointment today. Caller reports she has pain in her left shoulder and arm, which woke her up from sleep. Caller reports she doesn't feel quite right. Translation No Nurse Assessment Nurse: Rock Nephew, RN, Juliann Pulse Date/Time (Eastern Time): 04/04/2020 7:20:05 AM Confirm and document reason for call. If symptomatic, describe symptoms. ---Caller would like to scheduled an appointment today. Caller reports she has pain in her left shoulder and arm, which woke her up from sleep. Caller reports she "doesn't feel quite right" ( fatigued, which is very unusual for her). Has the patient had close contact with a person known or suspected to have the novel coronavirus illness OR traveled / lives in area with major community spread (including international travel) in the last 14 days from the onset of symptoms? * If Asymptomatic, screen for exposure and travel within the last 14 days. ---No Does the patient have any new or worsening symptoms? ---Yes Will a triage be completed? ---Yes Related visit to physician within the last 2 weeks? ---No Does the PT have any chronic conditions? (i.e. diabetes, asthma, this includes High risk factors for pregnancy, etc.) ---Yes List chronic conditions.  ---benign metastasizing leiomyoma, mild heart murmur Is this a behavioral health or substance abuse call? ---No Guidelines Guideline Title Affirmed Question Affirmed Notes Nurse Date/Time (Eastern Time) Shoulder Pain [1] Age > 40 AND [2] no obvious cause AND [3] pain even when not moving the arm Rock Nephew, RN, Juliann Pulse 04/04/2020 7:23:38 AM PLEASE NOTE: All timestamps contained within this report are represented as [place] Standard Time. CONFIDENTIALTY NOTICE: This fax transmission is intended only for the addressee. It contains information that is legally privileged, confidential or otherwise protected from use or disclosure. If you are not the intended recipient, you are strictly prohibited from reviewing, disclosing, copying using or disseminating any of this information or taking any action in reliance on or regarding this information. If you have received this fax in error, please notify us immediately by telephone so that we can arrange for its return to [place]. Phone: [phone], Toll-Free: [phone], Fax: [phone] Page: 2 of 2 Call Id: [id number] Guidelines Guideline Title Affirmed Question Affirmed Notes Nurse Date/Time Eilene Ghazi Time) (Exception: pain is clearly made worse by moving arm or bending neck) Disp. Time Eilene Ghazi Time) Disposition Final User 04/04/2020 7:27:54 AM Go to ED Now Yes Rock Nephew, RN, Gara Kroner Disagree/Comply Disagree Caller Understands Yes PreDisposition Call Doctor Care Advice Given Per Guideline GO TO ED NOW: * You need to be seen in the Emergency Department. * Another adult should drive. CARE ADVICE given per Shoulder Pain (Adult) guideline Referrals GO TO FACILITY REFUSED

## 2020-04-05 ENCOUNTER — Other Ambulatory Visit: Payer: Self-pay | Admitting: Family Medicine

## 2020-04-05 DIAGNOSIS — Z1231 Encounter for screening mammogram for malignant neoplasm of breast: Secondary | ICD-10-CM

## 2020-06-07 ENCOUNTER — Other Ambulatory Visit: Payer: Self-pay

## 2020-06-07 ENCOUNTER — Encounter: Payer: No Typology Code available for payment source | Admitting: Obstetrics and Gynecology

## 2020-06-07 ENCOUNTER — Ambulatory Visit: Payer: No Typology Code available for payment source | Admitting: Nurse Practitioner

## 2020-06-07 ENCOUNTER — Encounter: Payer: Self-pay | Admitting: Nurse Practitioner

## 2020-06-07 VITALS — BP 114/74 | Ht 65.0 in | Wt 139.0 lb

## 2020-06-07 DIAGNOSIS — Z90710 Acquired absence of both cervix and uterus: Secondary | ICD-10-CM

## 2020-06-07 DIAGNOSIS — Z01419 Encounter for gynecological examination (general) (routine) without abnormal findings: Secondary | ICD-10-CM | POA: Diagnosis not present

## 2020-06-07 NOTE — Progress Notes (Signed)
   Brooke Morrow 29-Mar-1956 [id number]   History:  64 y.o. [id number] presents for annual exam without GYN complaints. 2007 TVH for leiomyoma and rectocele, no HRT. History of benign metastisizing leiomyoma and lung nodules being followed by [facility].   Gynecologic History No LMP recorded. Patient has had a hysterectomy.   Last Pap: 01/26/2016. Results were: normal Last mammogram: 02/14/2017, ultrasound 02/22/2017. Results were: Left breast benign cysts Last colonoscopy: 2012. Results were: normal Last Dexa: 01/31/2016. Results were: normal, 5 year repeat recommended  Past medical history, past surgical history, family history and social history were all reviewed and documented in the EPIC chart.  ROS:  A ROS was performed and pertinent positives and negatives are included.  Exam:  Vitals:   06/07/20 1018  BP: 114/74  Weight: 139 lb (63 kg)  Height: 5\' 5"  (1.651 m)   Body mass index is 23.13 kg/m.  General appearance:  Normal Thyroid:  Symmetrical, normal in size, without palpable masses or nodularity. Respiratory  Auscultation:  Clear without wheezing or rhonchi Cardiovascular  Auscultation:  Regular rate with occasional extra beat, murmur, without rubs or gallops  Edema/varicosities:  Not grossly evident Abdominal  Soft,nontender, without masses, guarding or rebound.  Liver/spleen:  No organomegaly noted  Hernia:  None appreciated  Skin  Inspection:  Grossly normal   Breasts: Examined lying and sitting.   Right: Without masses, retractions, discharge or axillary adenopathy.   Left: Without masses, retractions, discharge or axillary adenopathy. Gentitourinary   Inguinal/mons:  Normal without inguinal adenopathy  External genitalia:  Normal  BUS/Urethra/Skene's glands:  Normal  Vagina:  Normal, atrophic changes  Cervix:  Absent  Uterus:  Absent  Adnexa/parametria:     Rt: Without masses or tenderness.   Lt: Without masses or tenderness.  Anus and  perineum: Normal  Assessment/Plan:  64 y.o. [id number] for annual exam.  Well female exam with routine gynecological exam - Education provided on SBEs, importance of preventative screenings, current guidelines, high calcium diet, regular exercise, and multivitamin daily.   History of total vaginal hysterectomy - no HRT  Screening for breast cancer - overdue for mammogram. [facility] provided on [facility] and encouraged to schedule soon.  Follow up in 1 year for annual      [place], 10:44 AM 06/07/2020

## 2020-06-07 NOTE — Patient Instructions (Signed)
[facility] °[phone] °[address]  °[place], [place] [address] ° ° ° °Health Maintenance, Female °Adopting a healthy lifestyle and getting preventive care are important in promoting health and wellness. Ask your health care provider about: °· The right schedule for you to have regular tests and exams. °· Things you can do on your own to prevent diseases and keep yourself healthy. °What should I know about diet, weight, and exercise? °Eat a healthy diet ° °· Eat a diet that includes plenty of vegetables, fruits, low-fat dairy products, and lean protein. °· Do not eat a lot of foods that are high in solid fats, added sugars, or sodium. °Maintain a healthy weight °Body mass index (BMI) is used to identify weight problems. It estimates body fat based on height and weight. Your health care provider can help determine your BMI and help you achieve or maintain a healthy weight. °Get regular exercise °Get regular exercise. This is one of the most important things you can do for your health. Most adults should: °· Exercise for at least 150 minutes each week. The exercise should increase your heart rate and make you sweat (moderate-intensity exercise). °· Do strengthening exercises at least twice a week. This is in addition to the moderate-intensity exercise. °· Spend less time sitting. Even light physical activity can be beneficial. °Watch cholesterol and blood lipids °Have your blood tested for lipids and cholesterol at 62 years of age, then have this test every 5 years. °Have your cholesterol levels checked more often if: °· Your lipid or cholesterol levels are high. °· You are older than 62 years of age. °· You are at high risk for heart disease. °What should I know about cancer screening? °Depending on your health history and family history, you may need to have cancer screening at various ages. This may include screening for: °· Breast cancer. °· Cervical cancer. °· Colorectal  cancer. °· Skin cancer. °· Lung cancer. °What should I know about heart disease, diabetes, and high blood pressure? °Blood pressure and heart disease °· High blood pressure causes heart disease and increases the risk of stroke. This is more likely to develop in people who have high blood pressure readings, are of African descent, or are overweight. °· Have your blood pressure checked: °? Every 3-5 years if you are 18-39 years of age. °? Every year if you are 40 years old or older. °Diabetes °Have regular diabetes screenings. This checks your fasting blood sugar level. Have the screening done: °· Once every three years after age 40 if you are at a normal weight and have a low risk for diabetes. °· More often and at a younger age if you are overweight or have a high risk for diabetes. °What should I know about preventing infection? °Hepatitis B °If you have a higher risk for hepatitis B, you should be screened for this virus. Talk with your health care provider to find out if you are at risk for hepatitis B infection. °Hepatitis C °Testing is recommended for: °· Everyone born from 1945 through 1965. °· Anyone with known risk factors for hepatitis C. °Sexually transmitted infections (STIs) °· Get screened for STIs, including gonorrhea and chlamydia, if: °? You are sexually active and are younger than 62 years of age. °? You are older than 62 years of age and your health care provider tells you that you are at risk for this type of infection. °? Your sexual activity has changed since you   last screened, and you are at increased risk for chlamydia or gonorrhea. Ask your health care provider if you are at risk.  Ask your health care provider about whether you are at high risk for HIV. Your health care provider may recommend a prescription medicine to help prevent HIV infection. If you choose to take medicine to prevent HIV, you should first get tested for HIV. You should then be tested every 3 months for as long as  you are taking the medicine. Pregnancy  If you are about to stop having your period (premenopausal) and you may become pregnant, seek counseling before you get pregnant.  Take 400 to 800 micrograms (mcg) of folic acid every day if you become pregnant.  Ask for birth control (contraception) if you want to prevent pregnancy. Osteoporosis and menopause Osteoporosis is a disease in which the bones lose minerals and strength with aging. This can result in bone fractures. If you are 32 years old or older, or if you are at risk for osteoporosis and fractures, ask your health care provider if you should:  Be screened for bone loss.  Take a calcium or vitamin D supplement to lower your risk of fractures.  Be given hormone replacement therapy (HRT) to treat symptoms of menopause. Follow these instructions at home: Lifestyle  Do not use any products that contain nicotine or tobacco, such as cigarettes, e-cigarettes, and chewing tobacco. If you need help quitting, ask your health care provider.  Do not use street drugs.  Do not share needles.  Ask your health care provider for help if you need support or information about quitting drugs. Alcohol use  Do not drink alcohol if: ? Your health care provider tells you not to drink. ? You are pregnant, may be pregnant, or are planning to become pregnant.  If you drink alcohol: ? Limit how much you use to 0-1 drink a day. ? Limit intake if you are breastfeeding.  Be aware of how much alcohol is in your drink. In the [place] one drink equals one 12 oz bottle of beer (355 mL), one 5 oz glass of wine (148 mL), or one 1 oz glass of hard liquor (44 mL). General instructions  Schedule regular health, dental, and eye exams.  Stay current with your vaccines.  Tell your health care provider if: ? You often feel depressed. ? You have ever been abused or do not feel safe at home. Summary  Adopting a healthy lifestyle and getting preventive care are  important in promoting health and wellness.  Follow your health care provider's instructions about healthy diet, exercising, and getting tested or screened for diseases.  Follow your health care provider's instructions on monitoring your cholesterol and blood pressure. This information is not intended to replace advice given to you by your health care provider. Make sure you discuss any questions you have with your health care provider. Document Revised: 11/12/2018 Document Reviewed: 11/12/2018 Elsevier Patient Education  2020 [employer].

## 2020-06-30 ENCOUNTER — Other Ambulatory Visit: Payer: Self-pay | Admitting: Nurse Practitioner

## 2020-06-30 DIAGNOSIS — Z1231 Encounter for screening mammogram for malignant neoplasm of breast: Secondary | ICD-10-CM

## 2020-07-04 ENCOUNTER — Telehealth: Payer: Self-pay

## 2020-07-04 NOTE — Telephone Encounter (Signed)
Pt notified of Dr. Tower's comments  

## 2020-07-04 NOTE — Telephone Encounter (Signed)
Patient contacted the office and states for the past 2 weeks, she has been noticing red bumps on legs. She states after a couple days, they did spread to her abdomen,. She states she noticed also that when she attempted to wash her face and makeup off with her normal cleanser, her face was very hot and she notified the red bumps had spread to her face also. She states the bumps are very itch - and has not used any no new soaps, lotions, or anything. She states she has tried an [employer], as well as benadryl and hydrocortisone cream to help with this, but it is not working. She also states she has been extremely fatigued  - & wondering if something is off with her immune system.  I did state that she would need an in person visit to evaluate this - but she states she has a CPE scheduled for Friday and she will wait until then, but she asked if I could send a message to Dr. Glori Bickers to see if she had any recommendations in the meantime?

## 2020-07-04 NOTE — Telephone Encounter (Signed)
Antihistamine as needed for itch , and also try to stay cool and out of the sun

## 2020-07-08 ENCOUNTER — Other Ambulatory Visit: Payer: Self-pay

## 2020-07-08 ENCOUNTER — Encounter: Payer: Self-pay | Admitting: Family Medicine

## 2020-07-08 ENCOUNTER — Ambulatory Visit (INDEPENDENT_AMBULATORY_CARE_PROVIDER_SITE_OTHER): Payer: 59 | Admitting: Family Medicine

## 2020-07-08 VITALS — BP 126/84 | HR 71 | Temp 96.9°F | Ht 64.5 in | Wt 139.1 lb

## 2020-07-08 DIAGNOSIS — Z13220 Encounter for screening for lipoid disorders: Secondary | ICD-10-CM

## 2020-07-08 DIAGNOSIS — Z23 Encounter for immunization: Secondary | ICD-10-CM

## 2020-07-08 DIAGNOSIS — Z0000 Encounter for general adult medical examination without abnormal findings: Secondary | ICD-10-CM

## 2020-07-08 LAB — COMPREHENSIVE METABOLIC PANEL
ALT: 17 U/L (ref 0–35)
AST: 20 U/L (ref 0–37)
Albumin: 4.2 g/dL (ref 3.5–5.2)
Alkaline Phosphatase: 63 U/L (ref 39–117)
BUN: 37 mg/dL — ABNORMAL HIGH (ref 6–23)
CO2: 31 mEq/L (ref 19–32)
Calcium: 9.5 mg/dL (ref 8.4–10.5)
Chloride: 104 mEq/L (ref 96–112)
Creatinine, Ser: 0.8 mg/dL (ref 0.40–1.20)
GFR: 72.72 mL/min (ref >60.00)
Glucose, Bld: 79 mg/dL (ref 70–99)
Potassium: 4.6 mEq/L (ref 3.5–5.1)
Sodium: 139 mEq/L (ref 135–145)
Total Bilirubin: 0.8 mg/dL (ref 0.2–1.2)
Total Protein: 6.6 g/dL (ref 6.0–8.3)

## 2020-07-08 LAB — CBC WITH DIFFERENTIAL/PLATELET
Basophils Absolute: 0 10*3/uL (ref 0.0–0.1)
Basophils Relative: 0.5 % (ref 0.0–3.0)
Eosinophils Absolute: 0.1 10*3/uL (ref 0.0–0.7)
Eosinophils Relative: 1.9 % (ref 0.0–5.0)
HCT: 37.3 % (ref 36.0–46.0)
Hemoglobin: 12.5 g/dL (ref 12.0–15.0)
Lymphocytes Relative: 40.2 % (ref 12.0–46.0)
Lymphs Abs: 1.5 10*3/uL (ref 0.7–4.0)
MCHC: 33.4 g/dL (ref 30.0–36.0)
MCV: 91.4 fl (ref 78.0–100.0)
Monocytes Absolute: 0.3 10*3/uL (ref 0.1–1.0)
Monocytes Relative: 7.2 % (ref 3.0–12.0)
Neutro Abs: 1.9 10*3/uL (ref 1.4–7.7)
Neutrophils Relative %: 50.2 % (ref 43.0–77.0)
Platelets: 221 10*3/uL (ref 150.0–400.0)
RBC: 4.08 Mil/uL (ref 3.87–5.11)
RDW: 12.9 % (ref 11.5–15.5)
WBC: 3.7 10*3/uL — ABNORMAL LOW (ref 4.0–10.5)

## 2020-07-08 LAB — LIPID PANEL
Cholesterol: 212 mg/dL — ABNORMAL HIGH (ref 0–200)
HDL: 73 mg/dL (ref >39.00)
LDL Cholesterol: 123 mg/dL — ABNORMAL HIGH (ref 0–99)
NonHDL: 138.65
Total CHOL/HDL Ratio: 3
Triglycerides: 77 mg/dL (ref 0.0–149.0)
VLDL: 15.4 mg/dL (ref 0.0–40.0)

## 2020-07-08 LAB — TSH: TSH: 3.38 u[IU]/mL (ref 0.35–4.50)

## 2020-07-08 NOTE — Patient Instructions (Addendum)
You are due for a tetanus shot   If you are interested in the shingles vaccine series (Shingrix), call your insurance or pharmacy to check on coverage and location it must be given.  If affordable - you can schedule it here or at your pharmacy depending on coverage   When you check out - stop to sign release so we can get last pap report   Labs today   Keep up the good work with healthy habits

## 2020-07-08 NOTE — Progress Notes (Signed)
Subjective:    Patient ID: Brooke Morrow, female    DOB: Apr 24, 1956, 64 y.o.   MRN: [id number]  This visit occurred during the SARS-CoV-2 public health emergency.  Safety protocols were in place, including screening questions prior to the visit, additional usage of staff PPE, and extensive cleaning of exam room while observing appropriate contact time as indicated for disinfecting solutions.    HPI Here for health maintenance exam and to review chronic medical problems    Wt Readings from Last 3 Encounters:  07/08/20 139 lb 2 oz (63.1 kg)  06/07/20 139 lb (63 kg)  04/04/20 141 lb (64 kg)   23.51 kg/m  Eats healthy and gets exercise  Walking regularly (took a break when she is sick)  Had a ?virus 2 weeks ago  Uri/aches - did in home test twice -both negative and one at [facility] that was negative as well  Then she had a rash -itchy bumps - all over  Had to use vaseline on it on her face - now improving  Took benadryl for days for the itch  Mostly better   No known tick bites /tick fever  She had [facility] once in the past  That felt different    covid status -had the pfizer vaccine 3/19, 4/9 Td 1/11 Flu vaccine-gets every fall    Exam was [employer] -sees a new NP  Relatively recently -last month had exam/pap   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  Doing well now   Mammogram 3/18  Has one scheduled for 8/10 /21 Self breast exam -no lumps  Just had visit with gyn   Colonoscopy 3/12  Patient Active Problem List   Diagnosis Date Noted   Routine general medical examination at a health care facility 07/08/2020   Left shoulder pain 04/04/2020   Screening mammogram, encounter for 09/15/2018   Lipid screening 09/15/2018   Diabetes mellitus screening 09/15/2018   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   right lower lobe pulmonary mass measures 1.8 x 2.2 cm 06/21/2015   Fibroid    Stress reaction 03/04/2012    Lipoma 03/04/2012   APHTHOUS ULCERS 06/09/2010   HEMOCCULT POSITIVE STOOL 10/22/2009   Past Medical History:  Diagnosis Date   Benign metastasizing leiomyoma of uterus 07/2015   Heart murmur    faint   [facility] spotted fever)    Past Surgical History:  Procedure Laterality Date   CESAREAN SECTION     LUNG SURGERY  07/2015   TONSILLECTOMY     VAGINAL HYSTERECTOMY  2007   Posterior colporrhaphy for leiomyoma and rectocele   Social History   Tobacco Use   Smoking status: Former Smoker    Quit date: 02/27/2006    Years since quitting: 14.3   Smokeless tobacco: Never Used  Vaping Use   Vaping Use: Never used  Substance Use Topics   Alcohol use: Yes    Alcohol/week: 0.0 standard drinks    Comment: occasional wine   Drug use: No   Family History  Problem Relation Age of Onset   Heart disease Mother 18       bypass and stents    Hypertension Mother    Heart disease Maternal Aunt    Diabetes Maternal Aunt    Heart disease Maternal Uncle    Diabetes Maternal Uncle    Heart disease Maternal Grandmother        pacemaker, stents   Diabetes Maternal Grandmother  Heart disease Maternal Grandfather 40   Diabetes Cousin    Allergies  Allergen Reactions   Codeine     REACTION: could not stay alert   Current Outpatient Medications on File Prior to Visit  Medication Sig Dispense Refill   cetirizine (ZYRTEC) 10 MG tablet Take 10 mg by mouth daily as needed for allergies.      fluticasone (FLONASE) 50 MCG/ACT nasal spray Place 1 spray into both nostrils daily as needed for allergies.      ibuprofen (ADVIL,MOTRIN) 200 MG tablet Take 200 mg by mouth every 4 (four) hours as needed for moderate pain. OTC as directed     loperamide (IMODIUM) 2 MG capsule Take 2 mg by mouth daily as needed for diarrhea or loose stools. Reported on 01/26/2016     Mouthwash Compounding Base LIQD Take 15 mLs by mouth 3 (three) times daily as needed (swish and spit  for mouth ulcers). 400 each 1   amoxicillin (AMOXIL) 500 MG capsule Take one capsule bid prn for mouth ulcers (Patient not taking: Reported on 06/07/2020) 20 capsule 0   Azelastine HCl 0.15 % SOLN Place 1-2 sprays into the nose 2 (two) times daily. As needed for allergy symptoms (Patient not taking: Reported on 06/07/2020) 30 mL 11   bismuth subsalicylate (PEPTO BISMOL) 262 MG/15ML suspension Take 15 mLs by mouth as needed. Reported on 01/26/2016 (Patient not taking: Reported on 06/07/2020)     No current facility-administered medications on file prior to visit.    Review of Systems  Constitutional: Positive for fatigue. Negative for activity change, appetite change, fever and unexpected weight change.       Getting energy back from recent virus  HENT: Negative for congestion, ear pain, rhinorrhea, sinus pressure and sore throat.   Eyes: Negative for pain, redness and visual disturbance.  Respiratory: Negative for cough, shortness of breath and wheezing.   Cardiovascular: Negative for chest pain and palpitations.  Gastrointestinal: Negative for abdominal pain, blood in stool, constipation and diarrhea.  Endocrine: Negative for polydipsia and polyuria.  Genitourinary: Negative for dysuria, frequency and urgency.  Musculoskeletal: Negative for arthralgias, back pain and myalgias.  Skin: Positive for rash. Negative for pallor.       Rash is better/much improved   Allergic/Immunologic: Negative for environmental allergies.  Neurological: Negative for dizziness, syncope and headaches.  Hematological: Negative for adenopathy. Does not bruise/bleed easily.  Psychiatric/Behavioral: Negative for decreased concentration and dysphoric mood. The patient is not nervous/anxious.        Objective:   Physical Exam Constitutional:      General: She is not in acute distress.    Appearance: Normal appearance. She is well-developed and normal weight. She is not ill-appearing or diaphoretic.  HENT:     Head:  Normocephalic and atraumatic.     Right Ear: Tympanic membrane, ear canal and external ear normal.     Left Ear: Tympanic membrane, ear canal and external ear normal.     Nose: Nose normal. No congestion.     Mouth/Throat:     Mouth: Mucous membranes are moist.     Pharynx: Oropharynx is clear. No posterior oropharyngeal erythema.  Eyes:     General: No scleral icterus.    Extraocular Movements: Extraocular movements intact.     Conjunctiva/sclera: Conjunctivae normal.     Pupils: Pupils are equal, round, and reactive to light.  Neck:     Thyroid: No thyromegaly.     Vascular: No carotid bruit or JVD.  Cardiovascular:  Rate and Rhythm: Normal rate and regular rhythm.     Pulses: Normal pulses.     Heart sounds: Normal heart sounds. No gallop.   Pulmonary:     Effort: Pulmonary effort is normal. No respiratory distress.     Breath sounds: Normal breath sounds. No wheezing.     Comments: Good air exch Chest:     Chest wall: No tenderness.  Abdominal:     General: Bowel sounds are normal. There is no distension or abdominal bruit.     Palpations: Abdomen is soft. There is no mass.     Tenderness: There is no abdominal tenderness.     Hernia: No hernia is present.  Genitourinary:    Comments: Breast exam: No mass, nodules, thickening, tenderness, bulging, retraction, inflamation, nipple discharge or skin changes noted.  No axillary or clavicular LA.     Musculoskeletal:        General: No tenderness. Normal range of motion.     Cervical back: Normal range of motion and neck supple. No rigidity. No muscular tenderness.     Right lower leg: No edema.     Left lower leg: No edema.     Comments: ni kyphosis   Lymphadenopathy:     Cervical: No cervical adenopathy.  Skin:    General: Skin is warm and dry.     Coloration: Skin is not pale.     Findings: No erythema or rash.     Comments: Solar lentigines diffusely Some angiomas  Neurological:     Mental Status: She is alert.  Mental status is at baseline.     Cranial Nerves: No cranial nerve deficit.     Motor: No abnormal muscle tone.     Coordination: Coordination normal.     Gait: Gait normal.     Deep Tendon Reflexes: Reflexes are normal and symmetric. Reflexes normal.  Psychiatric:        Mood and Affect: Mood normal.        Cognition and Memory: Cognition and memory normal.           Assessment & Plan:   Problem List Items Addressed This Visit      Other   Lipid screening    Lipid panel today      Relevant Orders   Lipid panel (Completed)   Routine general medical examination at a health care facility - Primary    Reviewed health habits including diet and exercise and skin cancer prevention Reviewed appropriate screening tests for age  Also reviewed health mt list, fam hx and immunization status , as well as social and family history   See HPI Good health habits Tdap today Plans flu vaccine in the fall  Had covid vaccine series  Up to date gyn care -sent for last pap report Mammogram is scheduled for next week  Labs ordered      Relevant Orders   CBC with Differential/Platelet (Completed)   Comprehensive metabolic panel (Completed)   Lipid panel (Completed)   TSH (Completed)    Other Visit Diagnoses    Need for Tdap vaccination       Relevant Orders   Tdap vaccine greater than or equal to 7yo IM (Completed)

## 2020-07-10 NOTE — Assessment & Plan Note (Signed)
Lipid panel today

## 2020-07-10 NOTE — Assessment & Plan Note (Signed)
Reviewed health habits including diet and exercise and skin cancer prevention Reviewed appropriate screening tests for age  Also reviewed health mt list, fam hx and immunization status , as well as social and family history   See HPI Good health habits Tdap today Plans flu vaccine in the fall  Had covid vaccine series  Up to date gyn care -sent for last pap report Mammogram is scheduled for next week  Labs ordered

## 2020-07-12 ENCOUNTER — Ambulatory Visit
Admission: RE | Admit: 2020-07-12 | Discharge: 2020-07-12 | Disposition: A | Discharge status: Home / Self Care | Payer: 59 | Source: Ambulatory Visit | Attending: Nurse Practitioner | Admitting: Nurse Practitioner

## 2020-07-12 ENCOUNTER — Other Ambulatory Visit: Payer: Self-pay

## 2020-07-12 DIAGNOSIS — Z1231 Encounter for screening mammogram for malignant neoplasm of breast: Secondary | ICD-10-CM

## 2020-09-09 ENCOUNTER — Encounter: Payer: Self-pay | Admitting: Family Medicine

## 2020-09-12 ENCOUNTER — Other Ambulatory Visit: Payer: Self-pay | Admitting: Family Medicine

## 2020-09-12 MED ORDER — MOUTHWASH COMPOUNDING BASE PO LIQD: 15.0000 mL | Freq: Three times a day (TID) | ORAL | 1 refills | Status: DC | PRN

## 2020-09-12 MED ORDER — AMOXICILLIN 500 MG PO CAPS: ORAL_CAPSULE | ORAL | 0 refills | Status: DC

## 2020-09-12 NOTE — Telephone Encounter (Signed)
Please see mychart message.

## 2020-09-13 ENCOUNTER — Telehealth: Payer: Self-pay | Admitting: *Deleted

## 2020-09-13 DIAGNOSIS — K120 Recurrent oral aphthae: Secondary | ICD-10-CM

## 2020-09-13 NOTE — Telephone Encounter (Signed)
Received fax requesting clarification on pt's mouthwash. Called [facility] and they said that they can't look at old refills and just fill it based off of her prev fill. They need the exact amount of each compound PCP wants. I asked what is the normal they use there and rep told me there is no "normal" mouthwash and that the prescriber has to tell them exactly how much prednisone, nystatin and lidocaine she wants in the mouthwash before they can fill med

## 2020-09-13 NOTE — Telephone Encounter (Signed)
Please ask pt if she has an old bottle of the mouthwash- the pharmacy needs amounts of each ingredient to put together.  They cannot look back at the last px.   If she has a bottle please get details or she can take it to the pharmacy

## 2020-09-14 NOTE — Telephone Encounter (Signed)
Pt doesn't have any old bottles they have expired and she has thrown them away. Pt is asking that we figure this out soon because she really needs the mouthwash

## 2020-09-14 NOTE — Telephone Encounter (Signed)
I would like a mouthwash with diphenhydramine and lidocaine and maalox [facility] has a listing for magic mouthwash with lidocaine I do not know the exact ratio/amt of each in it  Can they guide me at all with perhaps a standard order/common mix? Or if not, anyone else I can contact? Thanks so much!

## 2020-09-15 MED ORDER — MAGIC MOUTHWASH W/LIDOCAINE: 15.0000 mL | Freq: Three times a day (TID) | ORAL | 1 refills | Status: DC

## 2020-09-15 NOTE — Telephone Encounter (Signed)
Per Dr. Glori Bickers, the order pended is the what she would like sent to the pharmacy. She advised for this nurse to send to the pharmacy. Script printed instead of going to pharmacy electronically.    [facility] and spoke with [facility], Software engineer, and gave verbal order for script. Maris read back script for verification.  They will run it through their system to make sure they have the ingredients or the magic mouthwash and then run it through the pts insurance. They will contact the pt with the price.   Contacted the pt and advised a new script was called in and if any further problems to let this office know. Apologized for the difficulties with the original prescriptions. Pt appreciative and verbalized understanding.

## 2020-10-04 ENCOUNTER — Ambulatory Visit: Payer: 59 | Admitting: Family Medicine

## 2020-10-05 ENCOUNTER — Other Ambulatory Visit: Payer: Self-pay | Admitting: Family Medicine

## 2020-11-24 ENCOUNTER — Other Ambulatory Visit: Payer: Self-pay | Admitting: Family Medicine

## 2020-11-24 NOTE — Telephone Encounter (Signed)
CPE was 07/08/20. Last filled on 10/06/20 #20 caps with 0 refills

## 2021-01-14 ENCOUNTER — Encounter: Payer: Self-pay | Admitting: Family Medicine

## 2021-01-31 ENCOUNTER — Other Ambulatory Visit: Payer: Self-pay | Admitting: Family Medicine

## 2021-01-31 ENCOUNTER — Encounter: Payer: Self-pay | Admitting: Family Medicine

## 2021-02-01 MED ORDER — AMOXICILLIN 500 MG PO CAPS: ORAL_CAPSULE | ORAL | 0 refills | Status: DC

## 2021-02-01 NOTE — Telephone Encounter (Signed)
Will route to PCP to review message and also Raquel Sarna since she received the letter regarding Dr. Glori Bickers not in network

## 2021-02-02 ENCOUNTER — Other Ambulatory Visit: Payer: Self-pay | Admitting: Family Medicine

## 2021-02-02 DIAGNOSIS — K120 Recurrent oral aphthae: Secondary | ICD-10-CM

## 2021-02-02 MED ORDER — MAGIC MOUTHWASH W/LIDOCAINE: 15.0000 mL | Freq: Three times a day (TID) | ORAL | 0 refills | Status: DC

## 2021-02-02 NOTE — Telephone Encounter (Signed)
Pt left v/m that she will be at her destination in less than an hour and request cb when dukes mouth wash has been called in. In v/m pt had needs to be called in and not sent electronically and the pharmacy could not accept transfer of rx also. Sending note to [place].

## 2021-02-02 NOTE — Telephone Encounter (Signed)
Please call in , thanks

## 2021-02-02 NOTE — Telephone Encounter (Signed)
Pt called in wanted to know if the nurse can [facility] [place] to [facility] mouthwash due to she left it at home and [facility] they just need the office to call.   [phone] [facility] [place]

## 2021-02-02 NOTE — Telephone Encounter (Signed)
See prev note, okay to refill

## 2021-02-02 NOTE — Telephone Encounter (Signed)
Rx called in to pharmacy and pt notified.

## 2021-03-03 ENCOUNTER — Encounter: Payer: Self-pay | Admitting: Family Medicine

## 2021-05-22 ENCOUNTER — Other Ambulatory Visit: Payer: Self-pay | Admitting: Family Medicine

## 2021-05-23 NOTE — Telephone Encounter (Signed)
Last OV 07/08/2020 Next OV not scheduled. Last filled - 02/01/2021

## 2021-10-23 ENCOUNTER — Encounter: Payer: Self-pay | Admitting: Family Medicine

## 2021-10-23 DIAGNOSIS — K120 Recurrent oral aphthae: Secondary | ICD-10-CM

## 2021-10-23 NOTE — Telephone Encounter (Signed)
Please send both amox and the mouthwash They are pended  It will not let me send the mouthwash electronically

## 2021-10-25 ENCOUNTER — Other Ambulatory Visit: Payer: Self-pay | Admitting: Family Medicine

## 2021-10-25 DIAGNOSIS — K120 Recurrent oral aphthae: Secondary | ICD-10-CM

## 2021-10-25 MED ORDER — AMOXICILLIN 500 MG PO CAPS: ORAL_CAPSULE | ORAL | 0 refills | Status: DC

## 2021-10-25 MED ORDER — MAGIC MOUTHWASH W/LIDOCAINE: 15.0000 mL | Freq: Three times a day (TID) | ORAL | 0 refills | Status: DC

## 2022-01-03 ENCOUNTER — Other Ambulatory Visit: Payer: Self-pay | Admitting: Nurse Practitioner

## 2022-01-03 DIAGNOSIS — Z1231 Encounter for screening mammogram for malignant neoplasm of breast: Secondary | ICD-10-CM

## 2022-01-17 ENCOUNTER — Ambulatory Visit
Admission: RE | Admit: 2022-01-17 | Discharge: 2022-01-17 | Disposition: A | Discharge status: Home / Self Care | Payer: 59 | Source: Ambulatory Visit | Attending: Nurse Practitioner | Admitting: Nurse Practitioner

## 2022-01-17 ENCOUNTER — Other Ambulatory Visit: Payer: Self-pay

## 2022-01-17 ENCOUNTER — Ambulatory Visit: Payer: 59

## 2022-01-17 DIAGNOSIS — Z1231 Encounter for screening mammogram for malignant neoplasm of breast: Secondary | ICD-10-CM

## 2022-01-30 ENCOUNTER — Ambulatory Visit: Payer: 59 | Admitting: Nurse Practitioner

## 2022-01-30 DIAGNOSIS — Z0289 Encounter for other administrative examinations: Secondary | ICD-10-CM

## 2022-04-14 IMAGING — MG MM DIGITAL SCREENING BILAT W/ TOMO AND CAD
8 series · 8 of 24 positions shown · non-contrast
Comparison: Previous exam(s).

CLINICAL DATA: Screening.

EXAM:
DIGITAL SCREENING BILATERAL MAMMOGRAM WITH TOMOSYNTHESIS AND CAD
TECHNIQUE: Bilateral screening digital craniocaudal and mediolateral oblique
mammograms were obtained. Bilateral screening digital breast
tomosynthesis was performed. The images were evaluated with
computer-aided detection.

[R CC synth-2D]
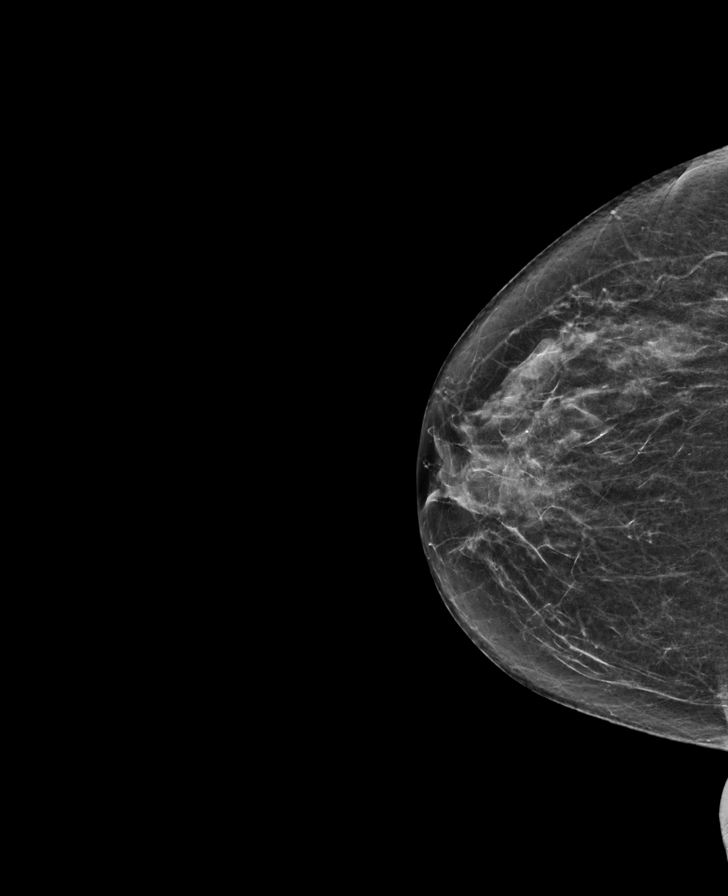

[L MLO synth-2D]
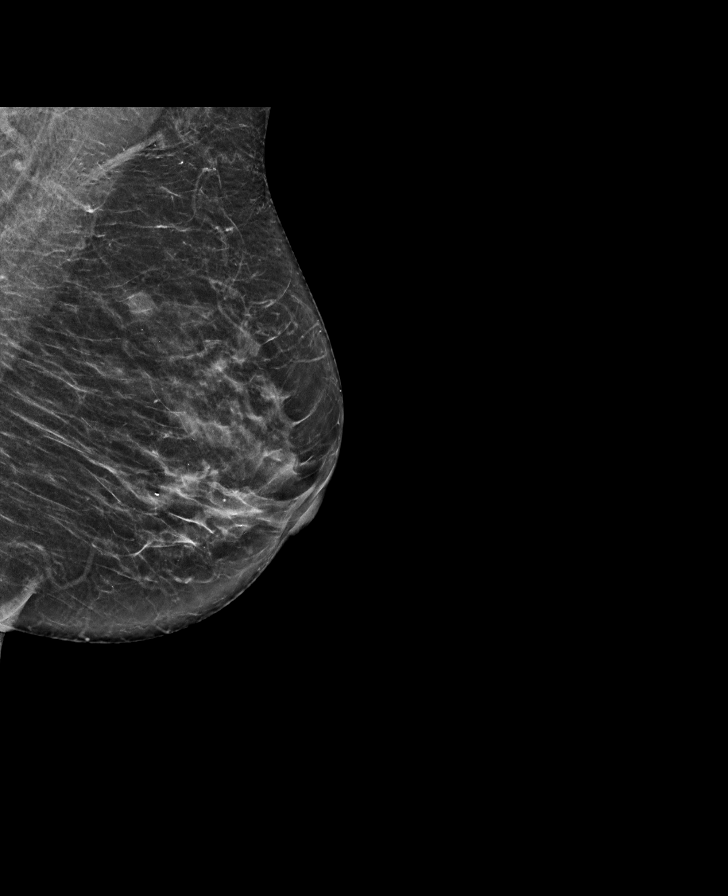

[L CC synth-2D]
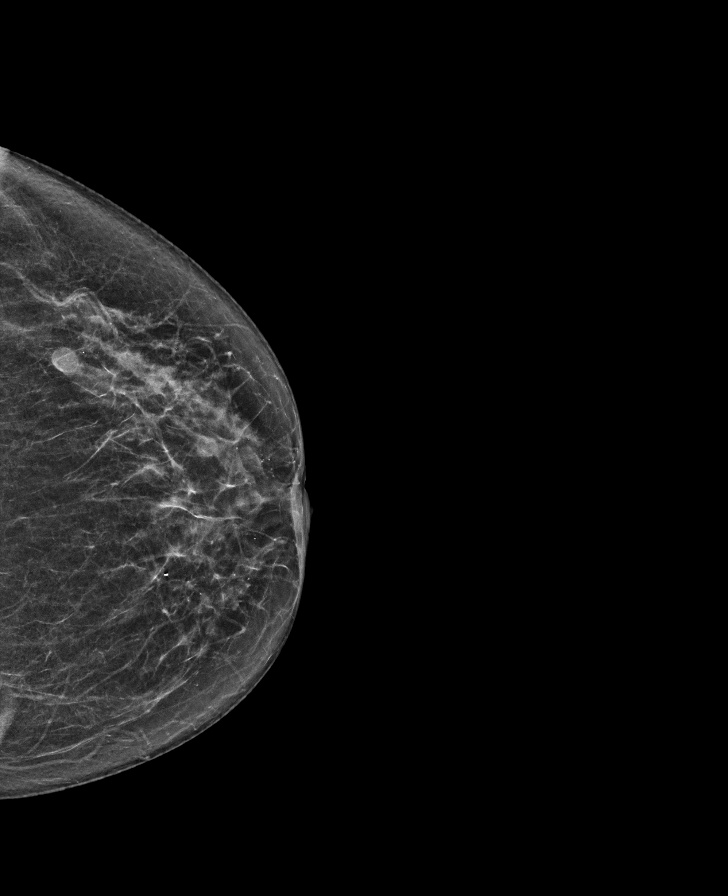

[R MLO synth-2D]
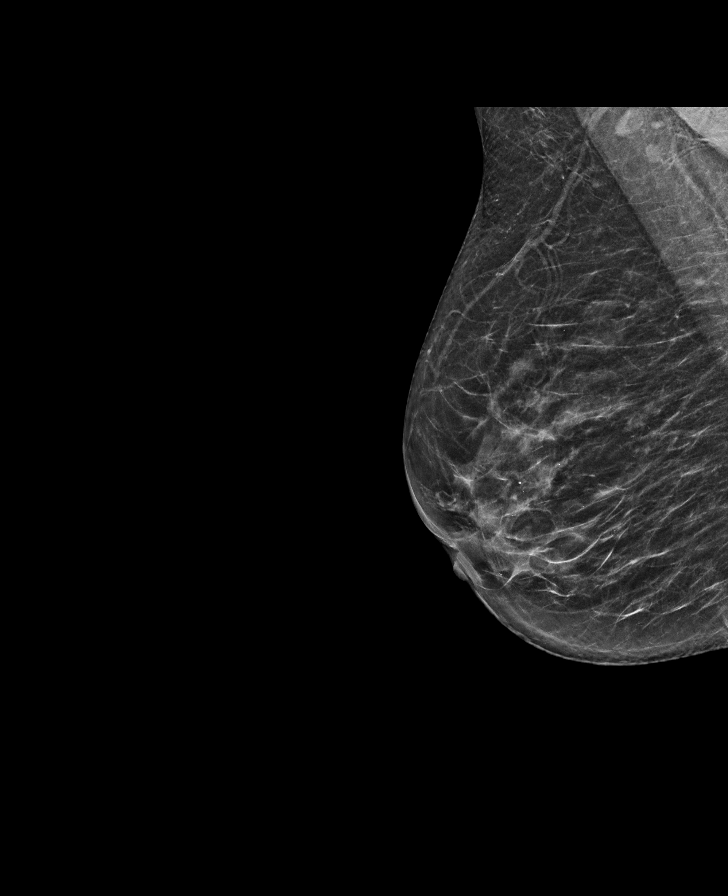

[L CC tomo · tomo slice 31/60.0]
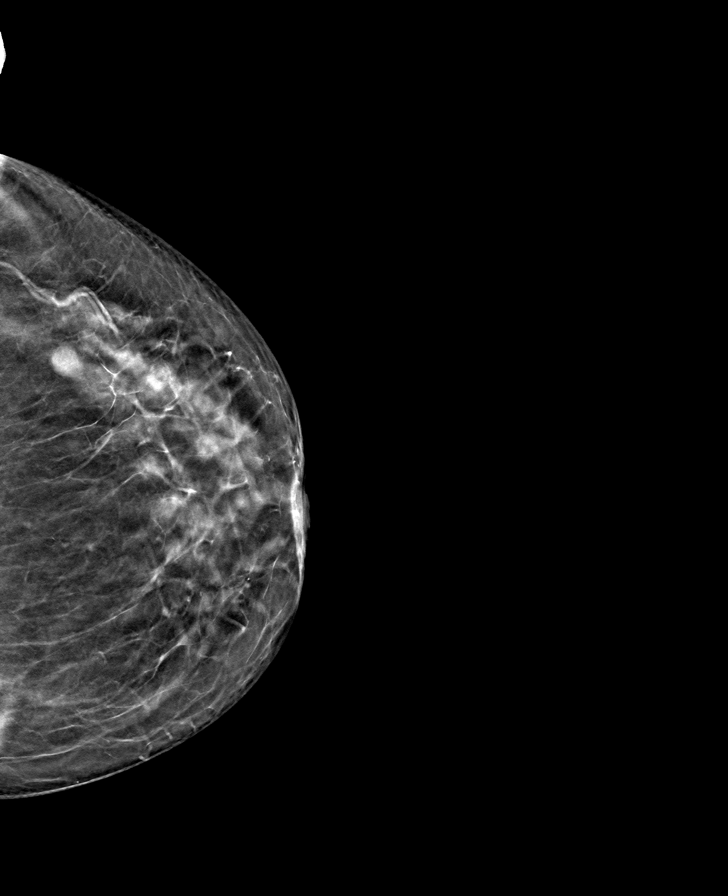

[R CC tomo · tomo slice 32/63.0]
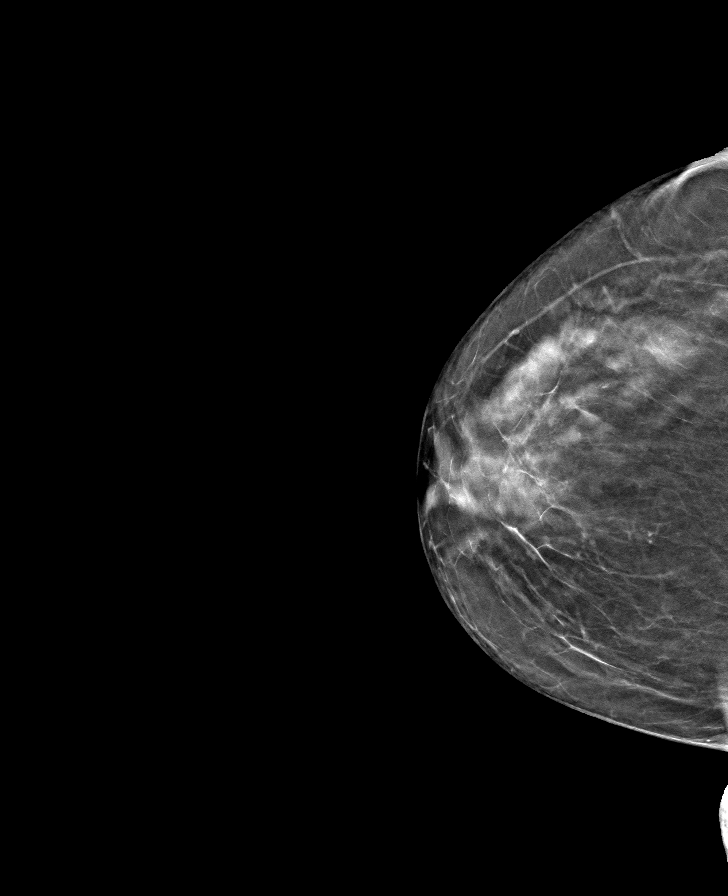

[L MLO tomo · tomo slice 33/66.0]
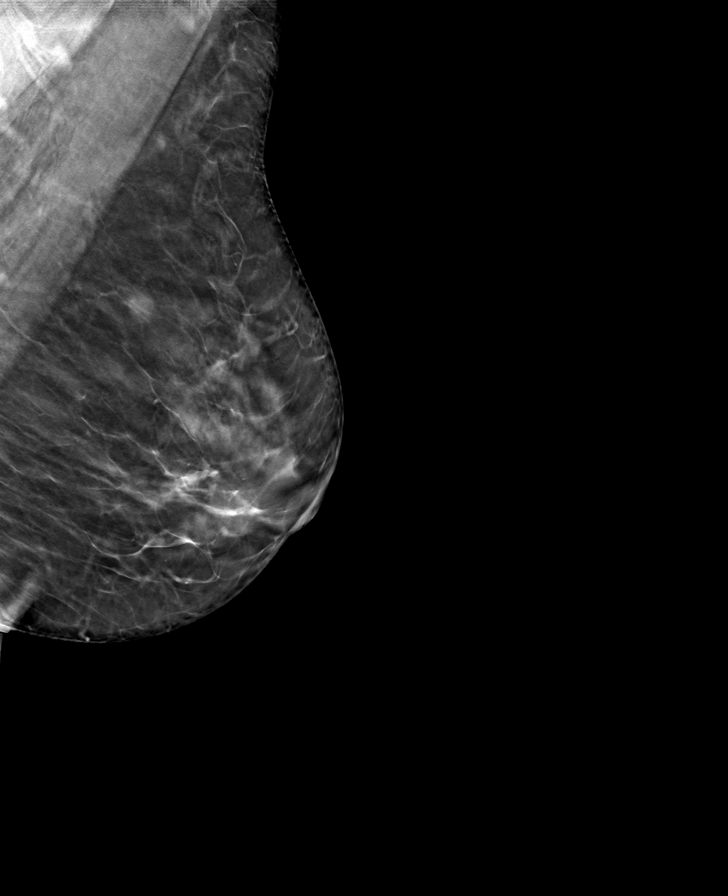

[R MLO tomo · tomo slice 33/65.0]
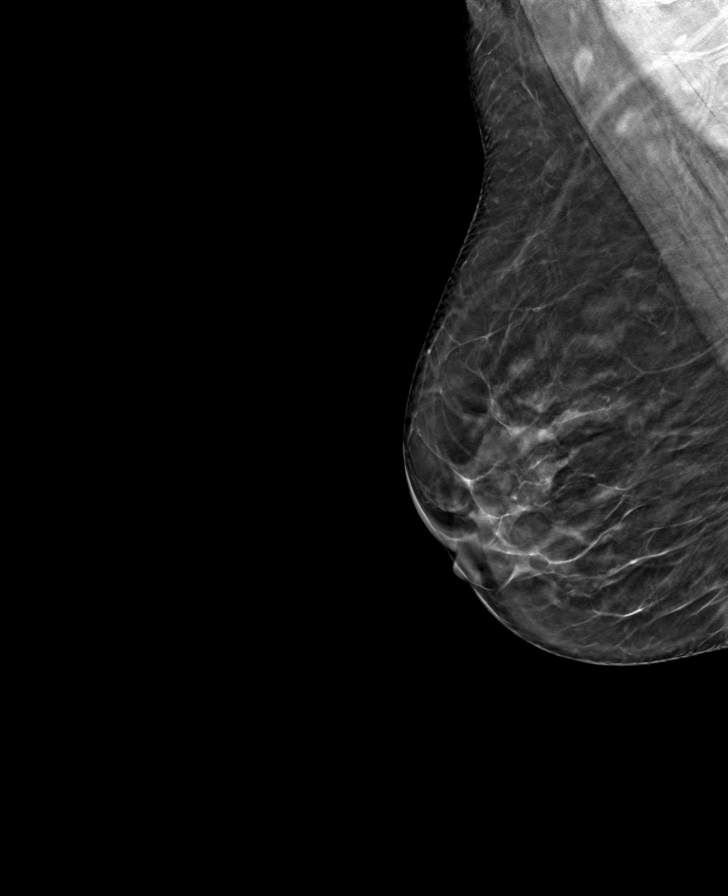

[8 of 24 positions shown; findings below may reference images not displayed]

ACR Breast Density Category c: The breast tissue is heterogeneously
dense, which may obscure small masses.
FINDINGS: There are no findings suspicious for malignancy.
IMPRESSION: No mammographic evidence of malignancy. A result letter of this
screening mammogram will be mailed directly to the patient.

RECOMMENDATION:
Screening mammogram in one year. (Code:Q3-W-BC3)

BI-RADS CATEGORY  1: Negative.

## 2022-05-16 ENCOUNTER — Ambulatory Visit: Payer: 59 | Admitting: Family Medicine

## 2022-05-16 ENCOUNTER — Encounter: Payer: Self-pay | Admitting: Family Medicine

## 2022-05-16 VITALS — BP 128/76 | HR 75 | Temp 97.8°F | Resp 16 | Ht 64.5 in | Wt 137.5 lb

## 2022-05-16 DIAGNOSIS — M21611 Bunion of right foot: Secondary | ICD-10-CM | POA: Diagnosis not present

## 2022-05-16 DIAGNOSIS — M5432 Sciatica, left side: Secondary | ICD-10-CM | POA: Diagnosis not present

## 2022-05-16 DIAGNOSIS — M7918 Myalgia, other site: Secondary | ICD-10-CM | POA: Diagnosis not present

## 2022-05-16 DIAGNOSIS — M5430 Sciatica, unspecified side: Secondary | ICD-10-CM | POA: Insufficient documentation

## 2022-05-16 MED ORDER — MELOXICAM 15 MG PO TABS: 15.0000 mg | ORAL_TABLET | Freq: Every day | ORAL | 1 refills | Status: DC | PRN

## 2022-05-16 NOTE — Patient Instructions (Addendum)
I placed a referral for podiatry  If you don't hear in 2 weeks let us know   Wear wide shoes or ones that stretch   Try meloxicam 15 mg daily with food as needed  Instead of ibuprofen  If side effects stop it and let us know   Make an appt with Dr Lorelei Pont at check out   Keep doing stretches if they help  Avoid exercise that hurts

## 2022-05-16 NOTE — Progress Notes (Signed)
Subjective:    Patient ID: Brooke Morrow, female    DOB: October 03, 1956, 66 y.o.   MRN: [id number]  HPI Pt presents for leg pain for 6 weeks/left Also bunions   Wt Readings from Last 3 Encounters:  05/16/22 137 lb 8 oz (62.4 kg)  07/08/20 139 lb 2 oz (63.1 kg)  06/07/20 139 lb (63 kg)   23.24 kg/m  End of April -pain in L buttock that goes down her leg  Thought it was from stress  Had a hot stone massage in May Was told her muscles are tight incl her shoulders -dx with tension  Then used a roller massager and did IT stretch Thought it was getting a little better   Her exercise of choice is walking but can't do it much due to pain  Tried to take a short walk and it got worse   Doing some exercises on line the past 2 d and helpful   Taking tylenol and ibuprofen prn    Has a bunion R foot   Patient Active Problem List   Diagnosis Date Noted   Sciatica 05/16/2022   Left buttock pain 05/16/2022   Bunion of great toe of right foot 05/16/2022   Routine general medical examination at a health care facility 07/08/2020   Left shoulder pain 04/04/2020   Screening mammogram, encounter for 09/15/2018   Lipid screening 09/15/2018   Diabetes mellitus screening 09/15/2018   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   right lower lobe pulmonary mass measures 1.8 x 2.2 cm 06/21/2015   Fibroid    Stress reaction 03/04/2012   Lipoma 03/04/2012   APHTHOUS ULCERS 06/09/2010   HEMOCCULT POSITIVE STOOL 10/22/2009   Past Medical History:  Diagnosis Date   Benign metastasizing leiomyoma of uterus 07/2015   Heart murmur    faint   RMSF [facility] spotted fever)    Past Surgical History:  Procedure Laterality Date   CESAREAN SECTION     LUNG SURGERY  07/2015   TONSILLECTOMY     VAGINAL HYSTERECTOMY  2007   Posterior colporrhaphy for leiomyoma and rectocele   Social History   Tobacco Use   Smoking status: Former    Types: Cigarettes    Quit date: 02/27/2006     Years since quitting: 16.2   Smokeless tobacco: Never  Vaping Use   Vaping Use: Never used  Substance Use Topics   Alcohol use: Yes    Alcohol/week: 0.0 standard drinks of alcohol    Comment: occasional wine   Drug use: No   Family History  Problem Relation Age of Onset   Heart disease Mother 64       bypass and stents    Hypertension Mother    Heart disease Maternal Aunt    Diabetes Maternal Aunt    Heart disease Maternal Uncle    Diabetes Maternal Uncle    Heart disease Maternal Grandmother        pacemaker, stents   Diabetes Maternal Grandmother    Heart disease Maternal Grandfather 40   Diabetes Cousin    Allergies  Allergen Reactions   Codeine     REACTION: could not stay alert   Current Outpatient Medications on File Prior to Visit  Medication Sig Dispense Refill   amoxicillin (AMOXIL) 500 MG capsule TAKE 1 CAPSULE BY MOUTH TWICE A DAY AS NEEDED FOR MOUTH ULCERS 20 capsule 0   Azelastine HCl 0.15 % SOLN Place 1-2 sprays into the nose 2 (two) times  daily. As needed for allergy symptoms 30 mL 11   bismuth subsalicylate (PEPTO BISMOL) 262 MG/15ML suspension Take 15 mLs by mouth as needed. Reported on 01/26/2016     cetirizine (ZYRTEC) 10 MG tablet Take 10 mg by mouth daily as needed for allergies.      fluticasone (FLONASE) 50 MCG/ACT nasal spray Place 1 spray into both nostrils daily as needed for allergies.      loperamide (IMODIUM) 2 MG capsule Take 2 mg by mouth daily as needed for diarrhea or loose stools. Reported on 01/26/2016     magic mouthwash w/lidocaine SOLN Take 15 mLs by mouth 3 (three) times daily. Swish and spit for mouth sores 400 mL 0   Mouthwash Compounding Base LIQD Take 15 mLs by mouth 3 (three) times daily as needed (swish and spit for mouth ulcers). 400 each 1   No current facility-administered medications on file prior to visit.      Review of Systems  Constitutional:  Negative for activity change, appetite change, fatigue, fever and  unexpected weight change.  HENT:  Negative for congestion, ear pain, rhinorrhea, sinus pressure and sore throat.   Eyes:  Negative for pain, redness and visual disturbance.  Respiratory:  Negative for cough, shortness of breath and wheezing.   Cardiovascular:  Negative for chest pain and palpitations.  Gastrointestinal:  Negative for abdominal pain, blood in stool, constipation and diarrhea.  Endocrine: Negative for polydipsia and polyuria.  Genitourinary:  Negative for dysuria, frequency and urgency.  Musculoskeletal:  Positive for back pain. Negative for arthralgias, gait problem, joint swelling and myalgias.  Skin:  Negative for pallor and rash.  Allergic/Immunologic: Negative for environmental allergies.  Neurological:  Negative for dizziness, syncope and headaches.  Hematological:  Negative for adenopathy. Does not bruise/bleed easily.  Psychiatric/Behavioral:  Negative for decreased concentration and dysphoric mood. The patient is not nervous/anxious.        Objective:   Physical Exam Constitutional:      General: She is not in acute distress.    Appearance: Normal appearance. She is normal weight. She is not ill-appearing.  HENT:     Head: Normocephalic and atraumatic.  Cardiovascular:     Rate and Rhythm: Normal rate and regular rhythm.     Heart sounds: Normal heart sounds.  Pulmonary:     Effort: Pulmonary effort is normal. No respiratory distress.     Breath sounds: Normal breath sounds. No wheezing.  Musculoskeletal:     Cervical back: No tenderness.     Comments: Right medial bunion-not tender   LS- some mild loss of lordosis No bony tenderness Tender in L lumbar musculature and SI area  No pain with rom of hip or SI stretch Nl rom of LS and LLE  Nl gait  Neg SLR bilaterally   Lymphadenopathy:     Cervical: No cervical adenopathy.  Skin:    General: Skin is warm and dry.     Findings: No bruising, erythema or rash.  Neurological:     Mental Status: She is  alert.     Sensory: No sensory deficit.     Motor: No weakness.     Coordination: Coordination normal.     Gait: Gait normal.     Deep Tendon Reflexes: Reflexes normal.  Psychiatric:        Mood and Affect: Mood normal.           Assessment & Plan:   Problem List Items Addressed This Visit  Nervous and Auditory   Sciatica - Primary    Pain primarily in L buttock /SI area and some in lateral hip (trochanter)  ? If lumbar spine involvement or muscle spasm  occ tingling in lower leg/minimal neuro sympt  Flared by walking   Px meloxicam to take with food as needed  Enc stretching  Heat/ice  Avoid exercise that makes it worse/ some component of overuse may need to cross train  Arrange appt with sport med         Musculoskeletal and Integument   Bunion of great toe of right foot    Referral to podiatry  Pt is interested in non surgical/shoe wear options for discomfort and to prevent worsening      Relevant Orders   Ambulatory referral to Podiatry     Other   Left buttock pain    Suspect sciatic irritation from SI, LS or spasm

## 2022-05-17 NOTE — Assessment & Plan Note (Signed)
Pain primarily in L buttock /SI area and some in lateral hip (trochanter)  ? If lumbar spine involvement or muscle spasm  occ tingling in lower leg/minimal neuro sympt  Flared by walking   Px meloxicam to take with food as needed  Enc stretching  Heat/ice  Avoid exercise that makes it worse/ some component of overuse may need to cross train  Arrange appt with sport med

## 2022-05-17 NOTE — Assessment & Plan Note (Signed)
Suspect sciatic irritation from SI, LS or spasm

## 2022-05-17 NOTE — Assessment & Plan Note (Signed)
Referral to podiatry  Pt is interested in non surgical/shoe wear options for discomfort and to prevent worsening

## 2022-05-21 ENCOUNTER — Encounter: Payer: Self-pay | Admitting: Family Medicine

## 2022-05-21 ENCOUNTER — Ambulatory Visit: Payer: 59 | Admitting: Family Medicine

## 2022-05-21 VITALS — BP 100/62 | HR 73 | Temp 98.3°F | Ht 64.5 in | Wt 139.5 lb

## 2022-05-21 DIAGNOSIS — M5416 Radiculopathy, lumbar region: Secondary | ICD-10-CM | POA: Diagnosis not present

## 2022-05-21 DIAGNOSIS — D259 Leiomyoma of uterus, unspecified: Secondary | ICD-10-CM

## 2022-05-21 NOTE — Progress Notes (Signed)
Brooke Patlan T. [name], MD, [name] at [facility] [place], [address]  Phone: [phone]  FAX: [place] - 66 y.o. female  MRN [id number]  Date of Birth: Jan 06, 1956  Date: 05/21/2022  PCP: Abner Greenspan, MD  Referral: Abner Greenspan, MD  Chief Complaint  Patient presents with   Leg Pain    Left-Sciatica   Subjective:   Brooke Morrow is a 66 y.o. very pleasant female patient with Body mass index is 23.58 kg/m. who presents with the following:  Patient presents with left-sided lumbar radiculopathy.  She is referred courtesy of my partner Brooke Morrow who evaluated the patient on May 16, 2022.  Pain started roughly in April 2023, and most recently she was given some meloxicam.  Review of chart with additional conservative measures, and these have all failed thus far.  She has done some oral NSAIDs of different classes.  Patient has had a history of metastatic uterine leiomyomas that have metastasized to the lung with a recent thoracic surgery consultation and biopsy.  Has had it and had some pain down the L side, and now when it started she had a hot stone massage.  Has started to get a lot better.  Pain had been fairly better.  Feeling some in the interior aspect of the buttocks.   Has been doing some great HEP, and she been doing a great job.  Wants to go back to her elliptical.  Normally, will get better more promptly.  No change in POC   Review of Systems is noted in the HPI, as appropriate   Objective:   BP 100/62   Pulse 73   Temp 98.3 F (36.8 C) (Oral)   Ht 5' 4.5" (1.638 m)   Wt 139 lb 8 oz (63.3 kg)   SpO2 97%   BMI 23.58 kg/m    Range of motion at  the waist: Flexion: normal Extension: normal Lateral bending: normal Rotation: all normal  No echymosis or edema Rises to examination table with no difficulty Gait: non antalgic  Inspection/Deformity:  N Paraspinus Tenderness: modest, l3-s1  B Ankle Dorsiflexion (L5,4): 5/5 B Great Toe Dorsiflexion (L5,4): 5/5 Heel Walk (L5): WNL Toe Walk (S1): WNL Rise/Squat (L4): WNL  SENSORY B Medial Foot (L4): WNL B Dorsum (L5): WNL B Lateral (S1): WNL Light Touch: WNL Pinprick: WNL  REFLEXES Knee (L4): 2+ Ankle (S1): 2+  B SLR, seated: neg B SLR, supine: neg B FABER: neg B Reverse FABER: neg B Greater Troch: NT B Log Roll: neg B Sciatic Notch: NT   Radiology: Procedure: FDG PET SKULLBASE TO MIDTHIGH SUBSEQUENT   Indication: 66 years Female Lung nodule, > 90m, R91.1 Solitary pulmonary  nodule.   Radiopharmaceutical: 10.2 mCi F18-FDG, intravenously.   Technique: PET imaging was performed using routine acquisition protocol  following intravenous administration of radiopharmaceutical. A separate  single breath-hold CT scan was performed at quiet end-expiration for  diagnosis, anatomic localization, and attenuation correction.   Serum glucose: 77 mg/dL  Uptake time: 55 min   Oral Contrast: None  IV Contrast: Isovue 3734 Complications: None   Comparison studies: PET from June 30, 2015 and CT chest from Apr 06, 2020.    FINDINGS:   Head & Neck:  *  Normal physiologic uptake is seen in the head and neck.  *  Nodes: No cervical adenopathy.  *  Thyroid: Unremarkable.   Chest:  *  Nodes: Small focal uptake seen in multiple left axillary lymph nodes  without any morphologic distortion of these nodes. This is favored  represent sequela of prior COVID vaccination.  *  Cardiovascular: Unremarkable.  *  Pulmonary: Evaluation of the lungs is limited due to respiratory motion.  Redemonstrated findings consistent with history of 2 right lower lobe wedge  resections. Previously mentioned cystic and solid nodule seen in right  lower lobe as seen on series 3, image 129 measuring 9 mm x 8 mm does not  demonstrate significant FDG avidity. Redemonstrated nodule in the right  lower  lobe measuring 7 mm on series 3, image 135 without significant FDG  avidity. No pleural effusions.   Abdomen & Pelvis:  *  Liver: Hypoattenuating focus in the dome of liver is consistent with a  hepatic cyst. Multiple other regions of hypoattenuation are seen that are  too small to characterize.  *  Gallbladder: Unremarkable.  *  Pancreas: Unremarkable.  *  Spleen: Normal FDG uptake, no focal lesions.  *  Adrenal glands: Unremarkable.  *  Kidneys: Unremarkable.  *  Genitourinary: Unremarkable.  *  Gastrointestinal: Normal in caliber with normal physiologic uptake.  *  Vasculature: Unremarkable.  *  Nodes: No retroperitoneal, mesenteric, or pelvic adenopathy.  *  Peritoneum: No free fluid.   Musculoskeletal:  *  Soft tissues: Unremarkable.  *  Bones: No concerning osseous lesions.    IMPRESSION:   1. There is no significant FDG uptake seen in right lower lobe nodule.  Indolent neoplasm is not totally excluded. Recommend attention on followup.  2. Redemonstrated right lower lobe nodules which appear stable from prior  exams and likewise does not demonstrate significant FDG uptake.   Please see separate diagnostic CT report.   Electronically Reviewed by:  Tiajuana Amass, MD, [facility]  Electronically Reviewed on:  04/20/2020 3:00 PM    Assessment and Plan:     ICD-10-CM   1. Lumbar radiculopathy, acute  M54.16     2. Benign metastasizing leiomyoma of uterus  D25.9      Total encounter time: 30 minutes. This includes total time spent on the day of encounter.  I spent an additional time reading prior notes including prior thoracic surgical notes, PET/CT, and other imaging from [facility].  Patient has classic lumbar radiculopathy/sciatica on the left side.  This is improving now with basic conservative care.  She showed me the rehab protocol that she is following, and is goes along with fairly classic McKenzie protocol.  She is doing a great job doing her rehab, she is also  doing some myofascial release, self from a hard ball.  I do not think that she needs to change anything, she can progress her activity as she is able.  I think that she will continue to do well.  Prior to her upcoming flight, I do think taking some meloxicam the day of would make some sense.  Otherwise, she does not really want to take medication, and I think that that is entirely reasonable.  Medication Management during today's office visit: No orders of the defined types were placed in this encounter.  There are no discontinued medications.  Orders placed today for conditions managed today: No orders of the defined types were placed in this encounter.   Follow-up if needed: No follow-ups on file.  [employer] One speech-to-text software was used for transcription in this dictation.  Possible transcriptional errors can occur using Editor, commissioning.   Signed,  Frederico Hamman  Celedonio Savage, MD   Outpatient Encounter Medications as of 05/21/2022  Medication Sig   amoxicillin (AMOXIL) 500 MG capsule TAKE 1 CAPSULE BY MOUTH TWICE A DAY AS NEEDED FOR MOUTH ULCERS   Azelastine HCl 0.15 % SOLN Place 1-2 sprays into the nose 2 (two) times daily. As needed for allergy symptoms   bismuth subsalicylate (PEPTO BISMOL) 262 MG/15ML suspension Take 15 mLs by mouth as needed. Reported on 01/26/2016   cetirizine (ZYRTEC) 10 MG tablet Take 10 mg by mouth daily as needed for allergies.    fluticasone (FLONASE) 50 MCG/ACT nasal spray Place 1 spray into both nostrils daily as needed for allergies.    loperamide (IMODIUM) 2 MG capsule Take 2 mg by mouth daily as needed for diarrhea or loose stools. Reported on 01/26/2016   magic mouthwash w/lidocaine SOLN Take 15 mLs by mouth 3 (three) times daily. Swish and spit for mouth sores   meloxicam (MOBIC) 15 MG tablet Take 1 tablet (15 mg total) by mouth daily as needed for pain. With food   Mouthwash Compounding Base LIQD Take 15 mLs by mouth 3 (three) times daily as  needed (swish and spit for mouth ulcers).   No facility-administered encounter medications on file as of 05/21/2022.

## 2022-06-07 ENCOUNTER — Ambulatory Visit: Payer: 59 | Admitting: Podiatry

## 2022-06-07 ENCOUNTER — Encounter: Payer: Self-pay | Admitting: Podiatry

## 2022-06-07 ENCOUNTER — Ambulatory Visit (INDEPENDENT_AMBULATORY_CARE_PROVIDER_SITE_OTHER): Payer: 59

## 2022-06-07 DIAGNOSIS — M2011 Hallux valgus (acquired), right foot: Secondary | ICD-10-CM

## 2022-06-07 NOTE — Progress Notes (Signed)
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 female presents with the above complaint.   ROS: Denies fever chills nausea vomiting muscle aches pains calf pain back pain chest pain shortness of breath.  Past Medical History:  Diagnosis Date   Benign metastasizing leiomyoma of uterus 07/2015   Heart murmur    faint   RMSF [facility] spotted fever)    Past Surgical History:  Procedure Laterality Date   CESAREAN SECTION     LUNG SURGERY  07/2015   TONSILLECTOMY     VAGINAL HYSTERECTOMY  2007   Posterior colporrhaphy for leiomyoma and rectocele    Current Outpatient Medications:    amoxicillin (AMOXIL) 500 MG capsule, TAKE 1 CAPSULE BY MOUTH TWICE A DAY AS NEEDED FOR MOUTH ULCERS, Disp: 20 capsule, Rfl: 0   Azelastine HCl 0.15 % SOLN, Place 1-2 sprays into the nose 2 (two) times daily. As needed for allergy symptoms, Disp: 30 mL, Rfl: 11   bismuth subsalicylate (PEPTO BISMOL) 262 MG/15ML suspension, Take 15 mLs by mouth as needed. Reported on 01/26/2016, Disp: , Rfl:    cetirizine (ZYRTEC) 10 MG tablet, Take 10 mg by mouth daily as needed for allergies. , Disp: , Rfl:    fluticasone (FLONASE) 50 MCG/ACT nasal spray, Place 1 spray into both nostrils daily as needed for allergies. , Disp: , Rfl:    loperamide (IMODIUM) 2 MG capsule, Take 2 mg by mouth daily as needed for diarrhea or loose stools. Reported on 01/26/2016, Disp: , Rfl:    magic mouthwash w/lidocaine SOLN, Take 15 mLs by mouth 3 (three) times daily. Swish and spit for mouth sores, Disp: 400 mL, Rfl: 0   meloxicam (MOBIC) 15 MG tablet, Take 1 tablet (15 mg total) by mouth daily as needed for pain. With food, Disp: 30 tablet, Rfl: 1   Mouthwash Compounding Base LIQD, Take 15  mLs by mouth 3 (three) times daily as needed (swish and spit for mouth ulcers)., Disp: 400 each, Rfl: 1  Allergies  Allergen Reactions   Codeine     REACTION: could not stay alert   Review of Systems Objective:  There were no vitals filed for this visit.  General: Well developed, nourished, in no acute distress, alert and oriented x3   Dermatological: Skin is warm, dry and supple bilateral. Nails x 10 are well maintained; remaining integument appears unremarkable at this time. There are no open sores, no preulcerative lesions, no rash or signs of infection present.  Vascular: Dorsalis Pedis artery and Posterior Tibial artery pedal pulses are 2/4 bilateral with immedate capillary fill time. Pedal hair growth present. No varicosities and no lower extremity edema present bilateral.   Neruologic: Grossly intact via light touch bilateral. Vibratory intact via tuning fork bilateral. Protective threshold with Semmes Wienstein monofilament intact to all pedal sites bilateral. Patellar and Achilles deep tendon reflexes 2+ bilateral. No Babinski or clonus noted bilateral.   Musculoskeletal: No gross boney pedal deformities bilateral. No pain, crepitus, or limitation noted with foot and ankle range of motion bilateral. Muscular strength 5/5 in all groups tested bilateral.  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  She has encroachment of the hallux on the second toe and  a small callus on the plantar lateral aspect of the tuft of the hallux from the second toe walking on it.  She has no crepitation full range of motion easily reducible bunion deformity.  Gait: Unassisted, Nonantalgic.    Radiographs:  Increase in the first intermetatarsal angle of this osseously mature individual is indicative of hallux abductovalgus deformity with a lateral deviation angle which is increased as well.  No signs of osteoarthritic change no fractures no acute findings.  Assessment & Plan:   Assessment:  Hallux abductovalgus deformity right foot.  Plan: Discussed etiology pathology conservative surgical therapies at this point placed her in a toe separator discussed the need for surgical intervention she declined we will follow-up with me on an as-needed basis.     Selden Noteboom T. [place], [place]

## 2022-07-24 ENCOUNTER — Other Ambulatory Visit: Payer: Self-pay | Admitting: Family Medicine

## 2022-07-24 DIAGNOSIS — K120 Recurrent oral aphthae: Secondary | ICD-10-CM

## 2022-07-24 MED ORDER — AMOXICILLIN 500 MG PO CAPS: ORAL_CAPSULE | ORAL | 0 refills | Status: DC

## 2022-07-24 MED ORDER — MAGIC MOUTHWASH W/LIDOCAINE: 15.0000 mL | Freq: Three times a day (TID) | ORAL | 0 refills | Status: DC

## 2022-07-24 NOTE — Telephone Encounter (Signed)
Please call in magic mouthwash px (or print and fax) Epic will not let me send electronically

## 2022-08-28 ENCOUNTER — Encounter: Payer: Self-pay | Admitting: Obstetrics & Gynecology

## 2022-08-28 ENCOUNTER — Encounter: Payer: 59 | Admitting: Nurse Practitioner

## 2022-08-28 ENCOUNTER — Other Ambulatory Visit (HOSPITAL_COMMUNITY)
Admission: RE | Admit: 2022-08-28 | Discharge: 2022-08-28 | Disposition: A | Discharge status: Home / Self Care | Payer: 59 | Source: Ambulatory Visit | Attending: Obstetrics & Gynecology | Admitting: Obstetrics & Gynecology

## 2022-08-28 ENCOUNTER — Ambulatory Visit (INDEPENDENT_AMBULATORY_CARE_PROVIDER_SITE_OTHER): Payer: 59 | Admitting: Obstetrics & Gynecology

## 2022-08-28 VITALS — BP 116/80 | HR 67 | Ht 64.25 in | Wt 140.0 lb

## 2022-08-28 DIAGNOSIS — Z01419 Encounter for gynecological examination (general) (routine) without abnormal findings: Secondary | ICD-10-CM | POA: Diagnosis present

## 2022-08-28 DIAGNOSIS — Z1272 Encounter for screening for malignant neoplasm of vagina: Secondary | ICD-10-CM

## 2022-08-28 DIAGNOSIS — Z780 Asymptomatic menopausal state: Secondary | ICD-10-CM | POA: Diagnosis not present

## 2022-08-28 DIAGNOSIS — Z13820 Encounter for screening for osteoporosis: Secondary | ICD-10-CM

## 2022-08-28 NOTE — Progress Notes (Signed)
Brooke Morrow [birth year] [id number]   History:    66 y.o. [id number] Married.  Works in [employer].  RP: Established patient for annual gyn exam   HPI: Postmenopausal well on no HRT. S/P Total Vaginal Hysterectomy for Fibroids/Rectocele in 2007.  History of benign metastisizing leiomyoma and lung nodules being followed by [facility]. No pain with IC.  Last Pap Neg 01/2016.  Pap reflex today.  No Pelvic pain.  Breasts normal.  Mammo Neg 01/2022.  Colono Neg 2012, will schedule with Gastro.  BD Normal in 01/2016, schedule BD here now.  BMI 23.84. Active physically. Health labs with Fam MD.   Past medical history,surgical history, family history and social history were all reviewed and documented in the EPIC chart.  Gynecologic History No LMP recorded. Patient has had a hysterectomy.  Obstetric History OB History  Gravida Para Term Preterm AB Living  '3 2 2   1 2  '$ SAB IAB Ectopic Multiple Live Births  1            # Outcome Date GA Lbr Len/2nd Weight Sex Delivery Anes PTL Lv  3 SAB           2 Term           1 Term              ROS: A ROS was performed and pertinent positives and negatives are included in the history.  GENERAL: No fevers or chills. HEENT: No change in vision, no earache, sore throat or sinus congestion. NECK: No pain or stiffness. CARDIOVASCULAR: No chest pain or pressure. No palpitations. PULMONARY: No shortness of breath, cough or wheeze. GASTROINTESTINAL: No abdominal pain, nausea, vomiting or diarrhea, melena or bright red blood per rectum. GENITOURINARY: No urinary frequency, urgency, hesitancy or dysuria. MUSCULOSKELETAL: No joint or muscle pain, no back pain, no recent trauma. DERMATOLOGIC: No rash, no itching, no lesions. ENDOCRINE: No polyuria, polydipsia, no heat or cold intolerance. No recent change in weight. HEMATOLOGICAL: No anemia or easy bruising or bleeding. NEUROLOGIC: No headache, seizures, numbness, tingling or weakness. PSYCHIATRIC: No depression,  no loss of interest in normal activity or change in sleep pattern.     Exam:   BP 116/80   Pulse 67   Ht 5' 4.25" (1.632 m)   Wt 140 lb (63.5 kg)   SpO2 95% Comment: cold fingers  BMI 23.84 kg/m   Body mass index is 23.84 kg/m.  General appearance : Well developed well nourished female. No acute distress HEENT: Eyes: no retinal hemorrhage or exudates,  Neck supple, trachea midline, no carotid bruits, no thyroidmegaly Lungs: Clear to auscultation, no rhonchi or wheezes, or rib retractions  Heart: Regular rate and rhythm, no murmurs or gallops Breast:Examined in sitting and supine position were symmetrical in appearance, no palpable masses or tenderness,  no skin retraction, no nipple inversion, no nipple discharge, no skin discoloration, no axillary or supraclavicular lymphadenopathy Abdomen: no palpable masses or tenderness, no rebound or guarding Extremities: no edema or skin discoloration or tenderness  Pelvic: Vulva: Normal             Vagina: No gross lesions or discharge.  Pap reflex done.  Cervix/Uterus absent  Adnexa  Without masses or tenderness  Anus: Normal   Assessment/Plan:  66 y.o. female for annual exam   1. Encounter for Papanicolaou smear of vagina as part of routine gynecological examination Postmenopausal well on no HRT.  S/P Total Vaginal Hysterectomy for Fibroids/Rectocele in 2007.  History of benign metastisizing leiomyoma and lung nodules being followed by [facility]. No pain with IC.  Last Pap Neg 01/2016.  Pap reflex today.  No Pelvic pain.  Breasts normal.  Mammo Neg 01/2022.  Colono Neg 2012, will schedule with Gastro.  BD Normal in 01/2016, schedule BD here now.  BMI 23.84. Active physically. Health labs with Fam MD. - Cytology - PAP( [place])  2. Postmenopausal Postmenopausal well on no HRT.  S/P Total Vaginal Hysterectomy for Fibroids/Rectocele in 2007.   3. Screening for osteoporosis  BD Normal in 01/2016, schedule BD here now.  BMI 23.84. Active  physically.  Vit D, Vit D2, Ca++ 1.5 g/d total. - DG Bone Density; Future   Princess Bruins MD, 2:58 PM 08/28/2022

## 2022-08-31 LAB — CYTOLOGY - PAP: Diagnosis: NEGATIVE

## 2022-09-14 DIAGNOSIS — H52223 Regular astigmatism, bilateral: Secondary | ICD-10-CM | POA: Diagnosis not present

## 2022-09-14 DIAGNOSIS — H524 Presbyopia: Secondary | ICD-10-CM | POA: Diagnosis not present

## 2022-09-14 DIAGNOSIS — H348312 Tributary (branch) retinal vein occlusion, right eye, stable: Secondary | ICD-10-CM | POA: Diagnosis not present

## 2022-09-14 DIAGNOSIS — H5213 Myopia, bilateral: Secondary | ICD-10-CM | POA: Diagnosis not present

## 2022-10-04 ENCOUNTER — Encounter: Payer: Self-pay | Admitting: Family Medicine

## 2022-10-04 ENCOUNTER — Telehealth: Payer: BC Managed Care – PPO | Admitting: Physician Assistant

## 2022-10-04 DIAGNOSIS — B9689 Other specified bacterial agents as the cause of diseases classified elsewhere: Secondary | ICD-10-CM

## 2022-10-04 DIAGNOSIS — J019 Acute sinusitis, unspecified: Secondary | ICD-10-CM

## 2022-10-04 MED ORDER — FLUTICASONE PROPIONATE 50 MCG/ACT NA SUSP: 2.0000 | Freq: Every day | NASAL | 0 refills | Status: AC

## 2022-10-04 MED ORDER — AMOXICILLIN-POT CLAVULANATE 875-125 MG PO TABS: 1.0000 | ORAL_TABLET | Freq: Two times a day (BID) | ORAL | 0 refills | Status: DC

## 2022-10-04 MED ORDER — BENZONATATE 100 MG PO CAPS: 100.0000 mg | ORAL_CAPSULE | Freq: Three times a day (TID) | ORAL | 0 refills | Status: DC | PRN

## 2022-10-04 NOTE — Progress Notes (Signed)
Virtual Visit Consent   OCTA UPLINGER, you are scheduled for a virtual visit with a [facility] provider today. Just as with appointments in the office, your consent must be obtained to participate. Your consent will be active for this visit and any virtual visit you may have with one of our providers in the next 365 days. If you have a MyChart account, a copy of this consent can be sent to you electronically.  As this is a virtual visit, video technology does not allow for your provider to perform a traditional examination. This may limit your provider's ability to fully assess your condition. If your provider identifies any concerns that need to be evaluated in person or the need to arrange testing (such as labs, EKG, etc.), we will make arrangements to do so. Although advances in technology are sophisticated, we cannot ensure that it will always work on either your end or our end. If the connection with a video visit is poor, the visit may have to be switched to a telephone visit. With either a video or telephone visit, we are not always able to ensure that we have a secure connection.  By engaging in this virtual visit, you consent to the provision of healthcare and authorize for your insurance to be billed (if applicable) for the services provided during this visit. Depending on your insurance coverage, you may receive a charge related to this service.  I need to obtain your verbal consent now. Are you willing to proceed with your visit today? Brooke Morrow has provided verbal consent on 10/04/2022 for a virtual visit (video or telephone). Leeanne Rio, [place]  Date: 10/04/2022 8:38 AM  Virtual Visit via Video Note   I, Leeanne Rio, connected with  Brooke Morrow  ([id number], 1956/05/13) on 10/04/22 at  8:30 AM EDT by a video-enabled telemedicine application and verified that I am speaking with the correct person using two identifiers.  Location: Patient: Virtual  Visit Location Patient: Home Provider: Virtual Visit Location Provider: Home Office   I discussed the limitations of evaluation and management by telemedicine and the availability of in person appointments. The patient expressed understanding and agreed to proceed.    History of Present Illness: Brooke Morrow is a 66 y.o. who identifies as a female who was assigned female at birth, and is being seen today for possible sinusitis. Has been dealing with nasal and sinus congestion with sinus pressure, headache and some maxillary sinus pain. Denies fever, chills. Some dry cough but denies chest congestion. Denies recent travel or sick contact.   OTC medications -- Sudafed, Vicam throat lozenges, ricola cough drops.    HPI: HPI  Problems:  Patient Active Problem List   Diagnosis Date Noted   Sciatica 05/16/2022   Left buttock pain 05/16/2022   Bunion of great toe of right foot 05/16/2022   Routine general medical examination at a health care facility 07/08/2020   Left shoulder pain 04/04/2020   Screening mammogram, encounter for 09/15/2018   Lipid screening 09/15/2018   Diabetes mellitus screening 09/15/2018   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   right lower lobe pulmonary mass measures 1.8 x 2.2 cm 06/21/2015   Fibroid    Stress reaction 03/04/2012   Lipoma 03/04/2012   APHTHOUS ULCERS 06/09/2010   HEMOCCULT POSITIVE STOOL 10/22/2009    Allergies:  Allergies  Allergen Reactions   Codeine     REACTION: could not stay alert   Medications:  Current Outpatient  Medications:    amoxicillin-clavulanate (AUGMENTIN) 875-125 MG tablet, Take 1 tablet by mouth 2 (two) times daily., Disp: 14 tablet, Rfl: 0   benzonatate (TESSALON) 100 MG capsule, Take 1 capsule (100 mg total) by mouth 3 (three) times daily as needed for cough., Disp: 30 capsule, Rfl: 0   fluticasone (FLONASE) 50 MCG/ACT nasal spray, Place 2 sprays into both nostrils daily., Disp: 16 g, Rfl:  0  Observations/Objective: Patient is well-developed, well-nourished in no acute distress.  Resting comfortably at home.  Head is normocephalic, atraumatic.  No labored breathing. Speech is clear and coherent with logical content.  Patient is alert and oriented at baseline.   Assessment and Plan: 1. Acute bacterial sinusitis - amoxicillin-clavulanate (AUGMENTIN) 875-125 MG tablet; Take 1 tablet by mouth 2 (two) times daily.  Dispense: 14 tablet; Refill: 0 - fluticasone (FLONASE) 50 MCG/ACT nasal spray; Place 2 sprays into both nostrils daily.  Dispense: 16 g; Refill: 0 - benzonatate (TESSALON) 100 MG capsule; Take 1 capsule (100 mg total) by mouth 3 (three) times daily as needed for cough.  Dispense: 30 capsule; Refill: 0  Rx Augmentin.  Increase fluids.  Rest.  Saline nasal spray.  Probiotic.  Mucinex as directed.  Humidifier in bedroom. Flonase and Tessalon per orders.  Call or return to clinic if symptoms are not improving.   Follow Up Instructions: I discussed the assessment and treatment plan with the patient. The patient was provided an opportunity to ask questions and all were answered. The patient agreed with the plan and demonstrated an understanding of the instructions.  A copy of instructions were sent to the patient via MyChart unless otherwise noted below.   The patient was advised to call back or seek an in-person evaluation if the symptoms worsen or if the condition fails to improve as anticipated.  Time:  I spent 10 minutes with the patient via telehealth technology discussing the above problems/concerns.    Leeanne Rio, PA-C

## 2022-10-04 NOTE — Patient Instructions (Addendum)
Erling Conte, thank you for joining Leeanne Rio, PA-C for today's virtual visit.  While this provider is not your primary care provider (PCP), if your PCP is located in our provider database this encounter information will be shared with them immediately following your visit.   [facility] account gives you access to today's visit and all your visits, tests, and labs performed at [facility] " click here if you don't have a [facility] account or go to [email or web address]  Consent: (Patient) ANALEE MONTEE provided verbal consent for this virtual visit at the beginning of the encounter.  Current Medications:  Current Outpatient Medications:    amoxicillin (AMOXIL) 500 MG capsule, TAKE 1 CAPSULE BY MOUTH TWICE A DAY AS NEEDED FOR MOUTH ULCERS (Patient not taking: Reported on 08/28/2022), Disp: 20 capsule, Rfl: 0   bismuth subsalicylate (PEPTO BISMOL) 262 MG/15ML suspension, Take 15 mLs by mouth as needed. Reported on 01/26/2016 (Patient not taking: Reported on 08/28/2022), Disp: , Rfl:    cetirizine (ZYRTEC) 10 MG tablet, Take 10 mg by mouth daily as needed for allergies. , Disp: , Rfl:    fluticasone (FLONASE) 50 MCG/ACT nasal spray, Place 1 spray into both nostrils daily as needed for allergies. , Disp: , Rfl:    loperamide (IMODIUM) 2 MG capsule, Take 2 mg by mouth daily as needed for diarrhea or loose stools. Reported on 01/26/2016, Disp: , Rfl:    magic mouthwash w/lidocaine SOLN, Take 15 mLs by mouth 3 (three) times daily. Swish and spit for mouth sores (Patient not taking: Reported on 08/28/2022), Disp: 400 mL, Rfl: 0   Medications ordered in this encounter:  No orders of the defined types were placed in this encounter.    *If you need refills on other medications prior to your next appointment, please contact your pharmacy*  Follow-Up: Call back or seek an in-person evaluation if the symptoms worsen or if the condition fails to  improve as anticipated.  [place] [phone]  Other Instructions Don't forget to COVID test just t0o be cautious giving weekend plans.   Please take antibiotic as directed.  Increase fluid intake.  Use Saline nasal spray.  Take a daily multivitamin. Use Flonase nasal spray OTC. Ok to continue the [employer].  Place a humidifier in the bedroom.  Please call or return clinic if symptoms are not improving.  Sinusitis Sinusitis is redness, soreness, and swelling (inflammation) of the paranasal sinuses. Paranasal sinuses are air pockets within the bones of your face (beneath the eyes, the middle of the forehead, or above the eyes). In healthy paranasal sinuses, mucus is able to drain out, and air is able to circulate through them by way of your nose. However, when your paranasal sinuses are inflamed, mucus and air can become trapped. This can allow bacteria and other germs to grow and cause infection. Sinusitis can develop quickly and last only a short time (acute) or continue over a long period (chronic). Sinusitis that lasts for more than 12 weeks is considered chronic.  CAUSES  Causes of sinusitis include: Allergies. Structural abnormalities, such as displacement of the cartilage that separates your nostrils (deviated septum), which can decrease the air flow through your nose and sinuses and affect sinus drainage. Functional abnormalities, such as when the small hairs (cilia) that line your sinuses and help remove mucus do not work properly or are not present. SYMPTOMS  Symptoms of acute and chronic sinusitis are the same. The primary symptoms  are pain and pressure around the affected sinuses. Other symptoms include: Upper toothache. Earache. Headache. Bad breath. Decreased sense of smell and taste. A cough, which worsens when you are lying flat. Fatigue. Fever. Thick drainage from your nose, which often is green and may contain pus (purulent). Swelling and warmth over the  affected sinuses. DIAGNOSIS  Your caregiver will perform a physical exam. During the exam, your caregiver may: Look in your nose for signs of abnormal growths in your nostrils (nasal polyps). Tap over the affected sinus to check for signs of infection. View the inside of your sinuses (endoscopy) with a special imaging device with a light attached (endoscope), which is inserted into your sinuses. If your caregiver suspects that you have chronic sinusitis, one or more of the following tests may be recommended: Allergy tests. Nasal culture A sample of mucus is taken from your nose and sent to a lab and screened for bacteria. Nasal cytology A sample of mucus is taken from your nose and examined by your caregiver to determine if your sinusitis is related to an allergy. TREATMENT  Most cases of acute sinusitis are related to a viral infection and will resolve on their own within 10 days. Sometimes medicines are prescribed to help relieve symptoms (pain medicine, decongestants, nasal steroid sprays, or saline sprays).  However, for sinusitis related to a bacterial infection, your caregiver will prescribe antibiotic medicines. These are medicines that will help kill the bacteria causing the infection.  Rarely, sinusitis is caused by a fungal infection. In theses cases, your caregiver will prescribe antifungal medicine. For some cases of chronic sinusitis, surgery is needed. Generally, these are cases in which sinusitis recurs more than 3 times per year, despite other treatments. HOME CARE INSTRUCTIONS  Drink plenty of water. Water helps thin the mucus so your sinuses can drain more easily. Use a humidifier. Inhale steam 3 to 4 times a day (for example, sit in the bathroom with the shower running). Apply a warm, moist washcloth to your face 3 to 4 times a day, or as directed by your caregiver. Use saline nasal sprays to help moisten and clean your sinuses. Take over-the-counter or prescription medicines  for pain, discomfort, or fever only as directed by your caregiver. SEEK IMMEDIATE MEDICAL CARE IF: You have increasing pain or severe headaches. You have nausea, vomiting, or drowsiness. You have swelling around your face. You have vision problems. You have a stiff neck. You have difficulty breathing. MAKE SURE YOU:  Understand these instructions. Will watch your condition. Will get help right away if you are not doing well or get worse. Document Released: 11/19/2005 Document Revised: 02/11/2012 Document Reviewed: 12/04/2011 [facility] Patient Information 2014 [place], [place].    If you have been instructed to have an in-person evaluation today at a local Urgent Care facility, please use the link below. It will take you to a list of all of our available [facility], including address, phone number and hours of operation. Please do not delay care.  [facility]  If you or a family member do not have a primary care provider, use the link below to schedule a visit and establish care. When you choose a [facility] primary care physician or advanced practice provider, you gain a long-term partner in health. Find a Primary Care Provider  Learn more about 's in-office and virtual care options: [facility]

## 2022-10-10 ENCOUNTER — Encounter: Payer: Self-pay | Admitting: Family Medicine

## 2022-10-10 ENCOUNTER — Ambulatory Visit: Payer: BC Managed Care – PPO | Admitting: Family Medicine

## 2022-10-10 ENCOUNTER — Ambulatory Visit (INDEPENDENT_AMBULATORY_CARE_PROVIDER_SITE_OTHER)
Admission: RE | Admit: 2022-10-10 | Discharge: 2022-10-10 | Disposition: A | Discharge status: Home / Self Care | Payer: BC Managed Care – PPO | Source: Ambulatory Visit | Attending: Family Medicine | Admitting: Family Medicine

## 2022-10-10 VITALS — BP 126/68 | HR 69 | Temp 97.7°F | Ht 64.25 in | Wt 135.5 lb

## 2022-10-10 DIAGNOSIS — J069 Acute upper respiratory infection, unspecified: Secondary | ICD-10-CM

## 2022-10-10 DIAGNOSIS — B9689 Other specified bacterial agents as the cause of diseases classified elsewhere: Secondary | ICD-10-CM | POA: Diagnosis not present

## 2022-10-10 DIAGNOSIS — R059 Cough, unspecified: Secondary | ICD-10-CM

## 2022-10-10 DIAGNOSIS — J019 Acute sinusitis, unspecified: Secondary | ICD-10-CM | POA: Diagnosis not present

## 2022-10-10 LAB — POC INFLUENZA A&B (BINAX/QUICKVUE)
Influenza A, POC: NEGATIVE
Influenza B, POC: NEGATIVE

## 2022-10-10 LAB — POC COVID19 BINAXNOW: SARS Coronavirus 2 Ag: NEGATIVE

## 2022-10-10 MED ORDER — PREDNISONE 10 MG PO TABS: ORAL_TABLET | ORAL | 0 refills | Status: DC

## 2022-10-10 MED ORDER — BENZONATATE 200 MG PO CAPS: 200.0000 mg | ORAL_CAPSULE | Freq: Three times a day (TID) | ORAL | 1 refills | Status: DC | PRN

## 2022-10-10 NOTE — Patient Instructions (Addendum)
Take prednisone as directed for wheezing/ breathing (this will make you feel hyper and hungry) Drink fluids Mucinex is ok for congestion  I refilled the tessalon  Rest   Chest xray now  We will contact you with the reading   If you have trouble breathing please go to the hospital /ER Keep [place] posted   Update if not starting to improve in a week or if worsening

## 2022-10-10 NOTE — Progress Notes (Signed)
Subjective:    Patient ID: Brooke Morrow, female    DOB: 1956-11-22, 66 y.o.   MRN: [id number]  HPI Pt presents with uri symptoms   Wt Readings from Last 3 Encounters:  10/10/22 135 lb 8 oz (61.5 kg)  08/28/22 140 lb (63.5 kg)  05/21/22 139 lb 8 oz (63.3 kg)   23.08 kg/m  Family has covid   Had a virt visit on 11/2 and tx for sinusitis with augmentin   Pulse ox 98% today   Still feeling bad  Exhausted  Sob (worse yesterday)  Some wheezing  Cough- is bad a times , since Sunday  Phlegm is light brown  2 covid tests neg at home so far   No fever   Sinus pain is a lot better    2 fam members with covid  Working a lot of hours and run down   Otc : Tylenol  Sudafed  Tesslon- finished  2 augmentin left   Results for orders placed or performed in visit on 10/10/22  POC Influenza A&B(BINAX/QUICKVUE)  Result Value Ref Range   Influenza A, POC Negative Negative   Influenza B, POC Negative Negative  POC COVID-19 BinaxNow  Result Value Ref Range   SARS Coronavirus 2 Ag Negative Negative     DG Chest 2 View  Result Date: 10/10/2022 CLINICAL DATA:  Cough for 5 days, productive. EXAM: CHEST - 2 VIEW COMPARISON:  CT chest 06/21/2015 FINDINGS: The cardiomediastinal silhouette is normal. There is no focal consolidation or pulmonary edema. There is no pleural effusion or pneumothorax There is no acute osseous abnormality. IMPRESSION: No radiographic evidence of acute cardiopulmonary process. Electronically Signed   By: Valetta Mole M.D.   On: 10/10/2022 16:24      Review of Systems  Constitutional:  Positive for appetite change and fatigue. Negative for fever.       Some body aches   HENT:  Positive for congestion, postnasal drip, rhinorrhea, sinus pressure, sneezing and sore throat. Negative for ear pain.   Eyes:  Negative for pain and discharge.  Respiratory:  Positive for cough. Negative for shortness of breath, wheezing and stridor.   Cardiovascular:   Negative for chest pain.  Gastrointestinal:  Negative for diarrhea, nausea and vomiting.  Genitourinary:  Negative for frequency, hematuria and urgency.  Musculoskeletal:  Negative for arthralgias and myalgias.  Skin:  Negative for rash.  Neurological:  Positive for headaches. Negative for dizziness, weakness and light-headedness.  Psychiatric/Behavioral:  Negative for confusion and dysphoric mood.        Objective:   Physical Exam Constitutional:      General: She is not in acute distress.    Appearance: Normal appearance. She is well-developed and normal weight. She is not ill-appearing, toxic-appearing or diaphoretic.     Comments: Appears fatigued   HENT:     Head: Normocephalic and atraumatic.     Comments: Nares are injected and congested    No facial swelling      Right Ear: Tympanic membrane, ear canal and external ear normal.     Left Ear: Tympanic membrane, ear canal and external ear normal.     Nose: Congestion and rhinorrhea present.     Mouth/Throat:     Mouth: Mucous membranes are moist.     Pharynx: Oropharynx is clear. No oropharyngeal exudate or posterior oropharyngeal erythema.     Comments: Clear pnd  Eyes:     General:        Right eye:  No discharge.        Left eye: No discharge.     Conjunctiva/sclera: Conjunctivae normal.     Pupils: Pupils are equal, round, and reactive to light.  Cardiovascular:     Rate and Rhythm: Normal rate.     Heart sounds: Normal heart sounds.  Pulmonary:     Effort: Pulmonary effort is normal. No respiratory distress.     Breath sounds: No stridor. Rhonchi present. No wheezing or rales.     Comments: Good air exch Some scattered rhonchi  Chest:     Chest wall: No tenderness.  Musculoskeletal:     Cervical back: Normal range of motion and neck supple.  Lymphadenopathy:     Cervical: No cervical adenopathy.  Skin:    General: Skin is warm and dry.     Capillary Refill: Capillary refill takes less than 2 seconds.      Findings: No rash.  Neurological:     Mental Status: She is alert.     Cranial Nerves: No cranial nerve deficit.  Psychiatric:        Mood and Affect: Mood normal.           Assessment & Plan:   Problem List Items Addressed This Visit       Respiratory   Viral URI with cough - Primary    S/p tx for sinusitis (those symptoms are improved)- reviewed note and plan from 11/2 virtual visit  Neg flu and covid testing today  Reassuring cxr today   Suspect viral bronchitis Px prednisone  taper and tessalon  Recommend rest/fluids Disc sympt care and ER precautions Update if not starting to improve in a week or if worsening        Relevant Orders   POC Influenza A&B(BINAX/QUICKVUE) (Completed)   POC COVID-19 BinaxNow (Completed)   DG Chest 2 View (Completed)   Other Visit Diagnoses     Cough, unspecified type       Acute bacterial sinusitis       Relevant Medications   predniSONE (DELTASONE) 10 MG tablet   benzonatate (TESSALON) 200 MG capsule

## 2022-10-10 NOTE — Assessment & Plan Note (Addendum)
S/p tx for sinusitis (those symptoms are improved)- reviewed note and plan from 11/2 virtual visit  Neg flu and covid testing today  Reassuring cxr today   Suspect viral bronchitis Px prednisone  taper and tessalon  Recommend rest/fluids Disc sympt care and ER precautions Update if not starting to improve in a week or if worsening

## 2022-10-17 ENCOUNTER — Telehealth: Payer: Self-pay | Admitting: Family Medicine

## 2022-10-17 ENCOUNTER — Telehealth (INDEPENDENT_AMBULATORY_CARE_PROVIDER_SITE_OTHER): Payer: BC Managed Care – PPO | Admitting: Nurse Practitioner

## 2022-10-17 ENCOUNTER — Telehealth: Payer: BC Managed Care – PPO | Admitting: Family Medicine

## 2022-10-17 DIAGNOSIS — U071 COVID-19: Secondary | ICD-10-CM | POA: Insufficient documentation

## 2022-10-17 MED ORDER — MOLNUPIRAVIR EUA 200MG CAPSULE: 4.0000 | ORAL_CAPSULE | Freq: Two times a day (BID) | ORAL | 0 refills | Status: AC

## 2022-10-17 NOTE — Telephone Encounter (Signed)
[facility] - Client TELEPHONE ADVICE RECORD [facility] Patient Name: LAIANA FRATUS [place] Gender: Female DOB: [birth year] Age: 66 Y 54 M 6 D Return Phone Number: [phone] (Primary), [phone] (Secondary) Address[place]  [address] Client [facility] - Client Client Site [facility] Provider Glori Bickers, Roque Lias - MD Contact Type Call Who Is Calling Patient / Member / Family / Caregiver Call Type Triage / Clinical Relationship To Patient Self Return Phone Number [phone] (Primary) Chief Complaint Diarrhea Reason for Call Symptomatic / Request for [place] states she is covid positive. She is having diarrhea. Congestion. She is wanting prescription. Translation No Nurse Assessment Nurse: Lucky Cowboy, RN, Levada Dy Date/Time (Eastern Time): 10/17/2022 10:34:11 AM Confirm and document reason for call. If symptomatic, describe symptoms. ---Caller stated that she's been sick for about 2 weeks. Her husband started having s/s on Tues last week. She is COVID+ as of today, but her last test was Wed last week. She has diarrhea and feeling bad since yesterday. Does the patient have any new or worsening symptoms? ---Yes Will a triage be completed? ---Yes Related visit to physician within the last 2 weeks? ---Yes Does the PT have any chronic conditions? (i.e. diabetes, asthma, this includes High risk factors for pregnancy, etc.) ---Yes List chronic conditions. ---lung issues, tumors, seasonal allergies Is this a behavioral health or substance abuse call? ---No Guidelines Guideline Title Affirmed Question Affirmed Notes Nurse Date/Time (Eastern Time) COVID-19 - Diagnosed or Suspected MILD difficulty breathing (e.g., minimal/no SOB at rest, SOB with walking, pulse <100) Dew, RN, Levada Dy 10/17/2022 10:38:55 AM PLEASE NOTE: All timestamps  contained within this report are represented as [place] Standard Time. CONFIDENTIALTY NOTICE: This fax transmission is intended only for the addressee. It contains information that is legally privileged, confidential or otherwise protected from use or disclosure. If you are not the intended recipient, you are strictly prohibited from reviewing, disclosing, copying using or disseminating any of this information or taking any action in reliance on or regarding this information. If you have received this fax in error, please notify us immediately by telephone so that we can arrange for its return to [place]. Phone: [phone], Toll-Free: [phone], Fax: [phone] Page: 2 of 2 Call Id: [id number] [place]. Time Eilene Ghazi Time) Disposition Final User 10/17/2022 10:50:08 AM See HCP within 4 Hours (or PCP triage) Yes Lucky Cowboy, RN, Levada Dy Final Disposition 10/17/2022 10:50:08 AM See HCP within 4 Hours (or PCP triage) Yes Dew, RN, Marin Shutter Disagree/Comply Comply Caller Understands Yes PreDisposition Search internet for information Care Advice Given Per Guideline SEE HCP (OR PCP TRIAGE) WITHIN 4 HOURS: * IF OFFICE WILL BE OPEN: You need to be seen within the next 3 or 4 hours. Call your doctor (or NP/PA) now or as soon as the office opens. GENERAL CARE ADVICE FOR COVID-19 SYMPTOMS: * The symptoms are generally treated the same whether you have COVID-19, influenza or some other respiratory virus. * Cough: Use cough drops. * Feeling dehydrated: Drink extra liquids. If the air in your home is dry, use a humidifier. * Fever, Chills, and Sweats: For fever over 101 F (38.3 C), take acetaminophen every 4 to 6 hours (Adults 650 mg) OR ibuprofen every 6 to 8 hours (Adults 400 mg). Before taking any medicine, read all the instructions on the package. Do not take aspirin unless your doctor has prescribed it for you. Chills can sometimes come before a fever. You  may feel cold in your hands and feet. You may have  shivering. You may also feel sweaty as your body temperature goes down. * Muscle aches, headache, and other pains: Often this comes and goes with the fever. Take acetaminophen every 4 to 6 hours (Adults 650 mg) OR ibuprofen every 6 to 8 hours (Adults 400 mg). Before taking any medicine, read all the instructions on the package. * Sore throat: Try throat lozenges, hard candy or warm chicken broth. CALL BACK IF: * You become worse CARE ADVICE given per COVID-19 - DIAGNOSED OR SUSPECTED (Adult) guideline. Referrals REFERRED TO PCP OFFICE Warm transfer to [place]lin

## 2022-10-17 NOTE — Assessment & Plan Note (Signed)
Tested positive for COVID-19 at home.  Discussed antiviral treatment and later EUA only.  After joint discussion elected to pursue molnupiravir.  Discussed common side effects of medication inclusive of nausea and change in taste.  Did discuss CDC guidelines/recommendations in regards to self-isolation/quarantine.  Did discuss signs and symptoms when to be seen urgent or emergently.  Start molnupiravir soon as possible.  Can continue over-the-counter treatment such as guaifenesin, acetaminophen.  Patient to finish out prednisone and Tessalon [place] as needed.

## 2022-10-17 NOTE — Telephone Encounter (Signed)
Pt already has VV appt scheduled for 10/17/22 at 3:30 with Dr Glori Bickers. Sending note to Dr Glori Bickers.

## 2022-10-17 NOTE — Progress Notes (Signed)
Patient ID: Brooke Morrow, female    DOB: 1956/10/06, 66 y.o.   MRN: [id number]  Virtual visit completed through [place], a video enabled telemedicine application. Due to national recommendations of social distancing due to COVID-19, a virtual visit is felt to be most appropriate for this patient at this time. Reviewed limitations, risks, security and privacy concerns of performing a virtual visit and the availability of in person appointments. I also reviewed that there may be a patient responsible charge related to this service. The patient agreed to proceed.   Patient location: home Provider location: [place] at [facility], office Persons participating in this virtual visit: patient, provider   If any vitals were documented, they were collected by patient at home unless specified below.    There were no vitals taken for this visit.   CC: Covid 19 Subjective:   HPI: Brooke Morrow is a 66 y.o. female presenting on 10/17/2022 for Covid Positive (Sx started on 10/16/22-tested positive on 10/17/22-diarrhea, head congestion/stuffy, fatigue/malaise, achy, slight labored breathing. Has taking Immodium, Mucinex, just finished Augmentin course still taking Prednisone and Tessalon perles.)   Symptoms started on 10/16/2022 felt bd and some dirrhea Covid test 10/17/2022 that was positive Pfizer x2 and 3 boosters Flu vaccine up to date  Has  2 days of prednisone left. Using tessalon perals prn. Using mucin ex and tylenol    Relevant past medical, surgical, family and social history reviewed and updated as indicated. Interim medical history since our last visit reviewed. Allergies and medications reviewed and updated. Outpatient Medications Prior to Visit  Medication Sig Dispense Refill   benzonatate (TESSALON) 200 MG capsule Take 1 capsule (200 mg total) by mouth 3 (three) times daily as needed for cough. Swallow whole 30 capsule 1   predniSONE (DELTASONE) 10 MG tablet Take  3 pills once daily by mouth for 3 days, then 2 pills once daily for 3 days, then 1 pill once daily for 3 days and then stop 18 tablet 0   fluticasone (FLONASE) 50 MCG/ACT nasal spray Place 2 sprays into both nostrils daily. (Patient not taking: Reported on 10/17/2022) 16 g 0   amoxicillin-clavulanate (AUGMENTIN) 875-125 MG tablet Take 1 tablet by mouth 2 (two) times daily. 14 tablet 0   No facility-administered medications prior to visit.     Per HPI unless specifically indicated in ROS section below Review of Systems  Constitutional:  Positive for appetite change, chills and fatigue. Negative for fever.       Fluid intake sub par  HENT:  Positive for ear pain (right side feels full) and sinus pain. Negative for sore throat.   Respiratory:  Positive for cough (some production). Negative for shortness of breath.   Gastrointestinal:  Positive for abdominal pain and diarrhea. Negative for nausea and vomiting.  Musculoskeletal:  Positive for arthralgias and myalgias.  Neurological:  Positive for headaches.   Objective:  There were no vitals taken for this visit.  Wt Readings from Last 3 Encounters:  10/10/22 135 lb 8 oz (61.5 kg)  08/28/22 140 lb (63.5 kg)  05/21/22 139 lb 8 oz (63.3 kg)       Physical exam: Gen: alert, NAD, not ill appearing Pulm: speaks in complete sentences without increased work of breathing Psych: normal mood, normal thought content      Results for orders placed or performed in visit on 10/10/22  POC Influenza A&B(BINAX/QUICKVUE)  Result Value Ref Range   Influenza A, POC Negative Negative  Influenza B, POC Negative Negative  POC COVID-19 BinaxNow  Result Value Ref Range   SARS Coronavirus 2 Ag Negative Negative   Assessment & Plan:   Problem List Items Addressed This Visit       Other   COVID-19 - Primary    Tested positive for COVID-19 at home.  Discussed antiviral treatment and later EUA only.  After joint discussion elected to pursue  molnupiravir.  Discussed common side effects of medication inclusive of nausea and change in taste.  Did discuss CDC guidelines/recommendations in regards to self-isolation/quarantine.  Did discuss signs and symptoms when to be seen urgent or emergently.  Start molnupiravir soon as possible.  Can continue over-the-counter treatment such as guaifenesin, acetaminophen.  Patient to finish out prednisone and Tessalon Perles as needed.      Relevant Medications   molnupiravir EUA (LAGEVRIO) 200 mg CAPS capsule     Meds ordered this encounter  Medications   molnupiravir EUA (LAGEVRIO) 200 mg CAPS capsule    Sig: Take 4 capsules (800 mg total) by mouth 2 (two) times daily for 5 days.    Dispense:  40 capsule    Refill:  0    Order Specific Question:   Supervising Provider    Answer:   TOWER, MARNE A [[id number]]   No orders of the defined types were placed in this encounter.   I discussed the assessment and treatment plan with the patient. The patient was provided an opportunity to ask questions and all were answered. The patient agreed with the plan and demonstrated an understanding of the instructions. The patient was advised to call back or seek an in-person evaluation if the symptoms worsen or if the condition fails to improve as anticipated.  Follow up plan: Return if symptoms worsen or fail to improve.  Brooke Garret, NP

## 2022-10-17 NOTE — Telephone Encounter (Signed)
Patient called an stated she has tested positive for covid and already has lung issues and has been experiencing horrible diarrhea. Patient was sent to access nurse.

## 2022-10-19 ENCOUNTER — Ambulatory Visit: Payer: BC Managed Care – PPO | Admitting: Nurse Practitioner

## 2022-10-19 VITALS — BP 126/88 | HR 63 | Temp 97.8°F | Resp 18 | Wt 135.4 lb

## 2022-10-19 DIAGNOSIS — U071 COVID-19: Secondary | ICD-10-CM | POA: Diagnosis not present

## 2022-10-19 DIAGNOSIS — R051 Acute cough: Secondary | ICD-10-CM | POA: Insufficient documentation

## 2022-10-19 DIAGNOSIS — R0602 Shortness of breath: Secondary | ICD-10-CM | POA: Diagnosis not present

## 2022-10-19 MED ORDER — BENZONATATE 200 MG PO CAPS: 200.0000 mg | ORAL_CAPSULE | Freq: Three times a day (TID) | ORAL | 0 refills | Status: DC | PRN

## 2022-10-19 NOTE — Assessment & Plan Note (Signed)
Refill prescription for [employer]

## 2022-10-19 NOTE — Progress Notes (Signed)
   Acute Office Visit  Subjective:     Patient ID: Brooke Morrow, female    DOB: July 27, 1956, 66 y.o.   MRN: [id number]  Chief Complaint  Patient presents with   Cough    Shortness of breath more, pain in the right rib area, feet felt numb this morning, felt a little lightheaded this morning. Taking anti viral covid medication. Finishing Prednisone today.     Patient is in today for Shortness of breath  She was seen virtaully on and was started on the antiviral treatment  States that today is her last day of prednisone. States that she is taking the cough perales. Using tylenol and mucinex. States that sleep is sporatic   States that yesterday she had some right lower chest and today she has some numbness  Review of Systems  Constitutional:  Positive for chills, fever and malaise/fatigue.  Respiratory:  Positive for cough and shortness of breath.   Cardiovascular:  Positive for chest pain.  Gastrointestinal:  Negative for diarrhea, nausea and vomiting.  Neurological:  Positive for headaches.        Objective:    BP 126/88   Pulse 63   Temp 97.8 F (36.6 C) (Oral)   Resp 18   Wt 135 lb 6 oz (61.4 kg)   SpO2 98%   BMI 23.06 kg/m    Physical Exam Constitutional:      Appearance: Normal appearance. She is ill-appearing. She is not toxic-appearing.  HENT:     Right Ear: Ear canal and external ear normal.     Left Ear: Ear canal and external ear normal.  Cardiovascular:     Rate and Rhythm: Normal rate and regular rhythm.     Heart sounds: Normal heart sounds.  Pulmonary:     Effort: Pulmonary effort is normal. No respiratory distress.     Breath sounds: Normal breath sounds. No wheezing or rales.  Chest:     Chest wall: Tenderness present.  Neurological:     Mental Status: She is alert.     No results found for any visits on 10/19/22.      Assessment & Plan:   Problem List Items Addressed This Visit       Other   GQQPY-19 - Primary    Patient  to finish out antiviral medication.  Signs and symptoms reviewed when to seek urgent emergent healthcare over the weekend.  Lungs were clear to auscultation today in office      Shortness of breath    O2 sat of 98% in office.  Patient is finishing steroids today.  Lungs clear to auscultation without wheezing.  Did offer to send in additional prednisone for patient comfort she politely declined.      Acute cough    Refill prescription for Tessalon Perles.      Relevant Medications   benzonatate (TESSALON) 200 MG capsule    Meds ordered this encounter  Medications   benzonatate (TESSALON) 200 MG capsule    Sig: Take 1 capsule (200 mg total) by mouth 3 (three) times daily as needed for cough. Swallow whole    Dispense:  30 capsule    Refill:  0    Order Specific Question:   Supervising Provider    Answer:   TOWER, MARNE A [[id number]]    Return if symptoms worsen or fail to improve.  Romilda Garret, NP

## 2022-10-19 NOTE — Assessment & Plan Note (Signed)
Patient to finish out antiviral medication.  Signs and symptoms reviewed when to seek urgent emergent healthcare over the weekend.  Lungs were clear to auscultation today in office

## 2022-10-19 NOTE — Patient Instructions (Signed)
Nice to see you today Lungs sound clear today and your oxygen level is good in office I have refilled the cough medication  Continue using tylenol as directed Rest and drink plenty of fluids

## 2022-10-19 NOTE — Assessment & Plan Note (Signed)
O2 sat of 98% in office.  Patient is finishing steroids today.  Lungs clear to auscultation without wheezing.  Did offer to send in additional prednisone for patient comfort she politely declined.

## 2022-10-22 ENCOUNTER — Telehealth: Payer: Self-pay | Admitting: Family Medicine

## 2022-10-22 NOTE — Telephone Encounter (Signed)
Her ear was not infected on exam. She can use the flonase and get an antihistamine like claritin, zyrtec, allegra or xyal. If that does not offer relief she can use Afrin as directed no longer than 3 days in a row

## 2022-10-22 NOTE — Telephone Encounter (Signed)
Patient advised.

## 2022-10-22 NOTE — Telephone Encounter (Signed)
Patient was seen in office on Friday 10/19/22,she was told she had fluid and drainage in her ear. Her ear is now causing her a lot of pain now,and would like to know if an antibiotic  can be called in for her?

## 2022-11-08 ENCOUNTER — Telehealth: Payer: Self-pay | Admitting: Family Medicine

## 2022-11-08 DIAGNOSIS — Z0000 Encounter for general adult medical examination without abnormal findings: Secondary | ICD-10-CM

## 2022-11-08 DIAGNOSIS — Z13220 Encounter for screening for lipoid disorders: Secondary | ICD-10-CM

## 2022-11-08 DIAGNOSIS — Z131 Encounter for screening for diabetes mellitus: Secondary | ICD-10-CM

## 2022-11-08 NOTE — Telephone Encounter (Signed)
-----   Message from Velna Hatchet, RT sent at 11/06/2022  8:04 AM EST ----- Regarding: Fri 12/8 lab Patient is scheduled for cpx, please order future labs.  Thanks, Anda Kraft

## 2022-11-09 ENCOUNTER — Other Ambulatory Visit (INDEPENDENT_AMBULATORY_CARE_PROVIDER_SITE_OTHER): Payer: BC Managed Care – PPO

## 2022-11-09 DIAGNOSIS — Z0000 Encounter for general adult medical examination without abnormal findings: Secondary | ICD-10-CM

## 2022-11-09 LAB — COMPREHENSIVE METABOLIC PANEL
ALT: 15 U/L (ref 0–35)
AST: 19 U/L (ref 0–37)
Albumin: 4.5 g/dL (ref 3.5–5.2)
Alkaline Phosphatase: 66 U/L (ref 39–117)
BUN: 16 mg/dL (ref 6–23)
CO2: 31 mEq/L (ref 19–32)
Calcium: 10 mg/dL (ref 8.4–10.5)
Chloride: 104 mEq/L (ref 96–112)
Creatinine, Ser: 0.84 mg/dL (ref 0.40–1.20)
GFR: 73.57 mL/min (ref >60.00)
Glucose, Bld: 82 mg/dL (ref 70–99)
Potassium: 4.2 mEq/L (ref 3.5–5.1)
Sodium: 142 mEq/L (ref 135–145)
Total Bilirubin: 0.7 mg/dL (ref 0.2–1.2)
Total Protein: 6.9 g/dL (ref 6.0–8.3)

## 2022-11-09 LAB — CBC WITH DIFFERENTIAL/PLATELET
Basophils Absolute: 0 10*3/uL (ref 0.0–0.1)
Basophils Relative: 1 % (ref 0.0–3.0)
Eosinophils Absolute: 0.1 10*3/uL (ref 0.0–0.7)
Eosinophils Relative: 1.7 % (ref 0.0–5.0)
HCT: 37.3 % (ref 36.0–46.0)
Hemoglobin: 12.8 g/dL (ref 12.0–15.0)
Lymphocytes Relative: 37.7 % (ref 12.0–46.0)
Lymphs Abs: 1.4 10*3/uL (ref 0.7–4.0)
MCHC: 34.3 g/dL (ref 30.0–36.0)
MCV: 90.8 fl (ref 78.0–100.0)
Monocytes Absolute: 0.2 10*3/uL (ref 0.1–1.0)
Monocytes Relative: 6.5 % (ref 3.0–12.0)
Neutro Abs: 2 10*3/uL (ref 1.4–7.7)
Neutrophils Relative %: 53.1 % (ref 43.0–77.0)
Platelets: 254 10*3/uL (ref 150.0–400.0)
RBC: 4.11 Mil/uL (ref 3.87–5.11)
RDW: 13.6 % (ref 11.5–15.5)
WBC: 3.8 10*3/uL — ABNORMAL LOW (ref 4.0–10.5)

## 2022-11-09 LAB — LIPID PANEL
Cholesterol: 221 mg/dL — ABNORMAL HIGH (ref 0–200)
HDL: 81.3 mg/dL (ref >39.00)
LDL Cholesterol: 121 mg/dL — ABNORMAL HIGH (ref 0–99)
NonHDL: 139.51
Total CHOL/HDL Ratio: 3
Triglycerides: 92 mg/dL (ref 0.0–149.0)
VLDL: 18.4 mg/dL (ref 0.0–40.0)

## 2022-11-09 LAB — TSH: TSH: 2.6 u[IU]/mL (ref 0.35–5.50)

## 2022-11-14 ENCOUNTER — Ambulatory Visit (INDEPENDENT_AMBULATORY_CARE_PROVIDER_SITE_OTHER): Payer: BC Managed Care – PPO | Admitting: Family Medicine

## 2022-11-14 ENCOUNTER — Encounter: Payer: Self-pay | Admitting: Family Medicine

## 2022-11-14 ENCOUNTER — Encounter: Payer: Self-pay | Admitting: *Deleted

## 2022-11-14 ENCOUNTER — Telehealth: Payer: Self-pay | Admitting: Family Medicine

## 2022-11-14 VITALS — BP 118/66 | HR 75 | Temp 97.2°F | Ht 64.0 in | Wt 132.1 lb

## 2022-11-14 DIAGNOSIS — E7800 Pure hypercholesterolemia, unspecified: Secondary | ICD-10-CM

## 2022-11-14 DIAGNOSIS — Z0000 Encounter for general adult medical examination without abnormal findings: Secondary | ICD-10-CM

## 2022-11-14 DIAGNOSIS — H524 Presbyopia: Secondary | ICD-10-CM | POA: Diagnosis not present

## 2022-11-14 DIAGNOSIS — Z1211 Encounter for screening for malignant neoplasm of colon: Secondary | ICD-10-CM | POA: Diagnosis not present

## 2022-11-14 DIAGNOSIS — E785 Hyperlipidemia, unspecified: Secondary | ICD-10-CM | POA: Insufficient documentation

## 2022-11-14 DIAGNOSIS — H5213 Myopia, bilateral: Secondary | ICD-10-CM | POA: Diagnosis not present

## 2022-11-14 DIAGNOSIS — H52223 Regular astigmatism, bilateral: Secondary | ICD-10-CM | POA: Diagnosis not present

## 2022-11-14 DIAGNOSIS — H348312 Tributary (branch) retinal vein occlusion, right eye, stable: Secondary | ICD-10-CM | POA: Diagnosis not present

## 2022-11-14 NOTE — Assessment & Plan Note (Signed)
Reviewed health habits including diet and exercise and skin cancer prevention Reviewed appropriate screening tests for age  Also reviewed health mt list, fam hx and immunization status , as well as social and family history   See HPI Labs reviewed Plans to check on shingrix cost at pharmacy  Colonoscopy referral done for [facility], pt will call for appt Mammogram due in feb/pt aware [facility] care 08/2022

## 2022-11-14 NOTE — Telephone Encounter (Signed)
That is usually related to HTN or diabetes or very high cholesterol  She does not have any of those  Thanks for the heads up and please have him send [place] some notes

## 2022-11-14 NOTE — Patient Instructions (Addendum)
If you are interested in the shingles vaccine series (Shingrix), call your insurance or pharmacy to check on coverage and location it must be given.  If affordable - you can schedule it here or at your pharmacy depending on coverage   I placed a referral for colonoscopy  Call the [place] clinic to schedule that when you can  [phone]  If you are interested in a coronary calcium score test let us know     Your mammogram is due in February  You can call the breast center to schedule  Please call the location of your choice from the menu below to schedule your Mammogram and/or Bone Density appointment.    [facility]                      Phone:  [phone], [place] [address]                                                             Services: Traditional and 3D Mammogram, [place] Bone Density                 Phone: [phone] [address], [place] [address]    Service: Bone Density ONLY   *this site does NOT perform mammograms  [place]                        Phone:  [phone] [address], [place] [address]                                            Services:  3D Mammogram and [place] at [facility]   Phone:  [phone]   [place] [place], [place] [address]  Services: 3D Mammogram and Bone Density  [place] at [facility]  Phone:  [phone]   [address] [place], [place] [address]                                               Services:  3D Mammogram and Bone Density

## 2022-11-14 NOTE — Telephone Encounter (Signed)
Patient called in and stated that she had her physical this morning and forgot to ask a question for her eye doctor Dr. Matilde Sprang. She stated that her eye doctor sent a message to Dr. Glori Bickers that she has blood vessels bursting behind her eyes. She is going to the eye doctor today and will have her eyes diluted again to check and he wanted Dr. Glori Bickers insight on if anything could have triggered this or if anything showed up.  Please advise. Thank you!

## 2022-11-14 NOTE — Progress Notes (Signed)
Subjective:    Patient ID: Brooke Morrow, female    DOB: Apr 28, 1956, 66 y.o.   MRN: [id number]  HPI Here for health maintenance exam and to review chronic medical problems    Wt Readings from Last 3 Encounters:  11/14/22 132 lb 2 oz (59.9 kg)  10/19/22 135 lb 6 oz (61.4 kg)  10/10/22 135 lb 8 oz (61.5 kg)   22.68 kg/m  Finally getting back to normal after covid  Starting back with a little exercise   Work is very busy- catching up   Not planning retirement any time soon   Thinks she is healthy   Immunization History  Administered Date(s) Administered   Influenza Split 10/29/2012   Influenza Whole 12/20/2009, 11/16/2011   Influenza, Seasonal, Injecte, Preservative Fre 10/23/2017   Influenza,inj,Quad PF,6+ Mos 10/02/2016, 09/15/2018, 10/15/2019, 10/15/2020, 09/12/2021, 07/18/2022   Influenza-Unspecified 09/04/2015   PFIZER Comirnaty(Gray Top)Covid-19 Tri-Sucrose Vaccine 03/04/2021   PFIZER(Purple Top)SARS-COV-2 Vaccination 02/19/2020, 03/11/2020, 09/16/2020   Pfizer Covid-19 Vaccine Bivalent Booster 69yr & up 09/12/2021   Td 12/20/2009   Tdap 07/08/2020   Health Maintenance Due  Topic Date Due   Zoster Vaccines- Shingrix (1 of 2) Never done   COLONOSCOPY (Pts 45-478yrInsurance coverage will need to be confirmed)  02/27/2021   COVID-19 Vaccine (6 - 2023-24 season) 08/03/2022    Shingrix: is interested   Colonoscopy 02/2011 Wants to schedule at [facility]/2023 Self breast exam: no lumps   Pap 08/2022 nl Sees gyn  Has complex fibroid history  BP Readings from Last 3 Encounters:  11/14/22 118/66  10/19/22 126/88  10/10/22 126/68   Pulse Readings from Last 3 Encounters:  11/14/22 75  10/19/22 63  10/10/22 69    Cholesterol Lab Results  Component Value Date   CHOL 221 (H) 11/09/2022   CHOL 212 (H) 07/08/2020   CHOL 211 (H) 09/15/2018   Lab Results  Component Value Date   HDL 81.30 11/09/2022   HDL 73.00 07/08/2020   HDL 79.60  09/15/2018   Lab Results  Component Value Date   LDLCALC 121 (H) 11/09/2022   LDLCALC 123 (H) 07/08/2020   LDLCALC 120 (H) 09/15/2018   Lab Results  Component Value Date   TRIG 92.0 11/09/2022   TRIG 77.0 07/08/2020   TRIG 57.0 09/15/2018   Lab Results  Component Value Date   CHOLHDL 3 11/09/2022   CHOLHDL 3 07/08/2020   CHOLHDL 3 09/15/2018   No results found for: "LDLDIRECT" Her mother head early CAD The 10-year ASCVD risk score (Arnett DK, et al., 2019) is: 3.8%   Values used to calculate the score:     Age: [age]ears     Sex: Female     Is Non-Hispanic African American: No     Diabetic: No     Tobacco smoker: No     Systolic Blood Pressure: 11004mHg     Is BP treated: No     HDL Cholesterol: 81.3 mg/dL     Total Cholesterol: 221 mg/dL  Diet is very very good   Results for orders placed or performed in visit on 11/09/22  CBC with Differential/Platelet  Result Value Ref Range   WBC 3.8 (L) 4.0 - 10.5 K/uL   RBC 4.11 3.87 - 5.11 Mil/uL   Hemoglobin 12.8 12.0 - 15.0 g/dL   HCT 37.3 36.0 - 46.0 %   MCV 90.8 78.0 - 100.0 fl   MCHC 34.3 30.0 - 36.0 g/dL   RDW  13.6 11.5 - 15.5 %   Platelets 254.0 150.0 - 400.0 K/uL   Neutrophils Relative % 53.1 43.0 - 77.0 %   Lymphocytes Relative 37.7 12.0 - 46.0 %   Monocytes Relative 6.5 3.0 - 12.0 %   Eosinophils Relative 1.7 0.0 - 5.0 %   Basophils Relative 1.0 0.0 - 3.0 %   Neutro Abs 2.0 1.4 - 7.7 K/uL   Lymphs Abs 1.4 0.7 - 4.0 K/uL   Monocytes Absolute 0.2 0.1 - 1.0 K/uL   Eosinophils Absolute 0.1 0.0 - 0.7 K/uL   Basophils Absolute 0.0 0.0 - 0.1 K/uL  Comprehensive metabolic panel  Result Value Ref Range   Sodium 142 135 - 145 mEq/L   Potassium 4.2 3.5 - 5.1 mEq/L   Chloride 104 96 - 112 mEq/L   CO2 31 19 - 32 mEq/L   Glucose, Bld 82 70 - 99 mg/dL   BUN 16 6 - 23 mg/dL   Creatinine, Ser 0.84 0.40 - 1.20 mg/dL   Total Bilirubin 0.7 0.2 - 1.2 mg/dL   Alkaline Phosphatase 66 39 - 117 U/L   AST 19 0 - 37 U/L   ALT  15 0 - 35 U/L   Total Protein 6.9 6.0 - 8.3 g/dL   Albumin 4.5 3.5 - 5.2 g/dL   GFR 73.57 >60.00 mL/min   Calcium 10.0 8.4 - 10.5 mg/dL  Lipid panel  Result Value Ref Range   Cholesterol 221 (H) 0 - 200 mg/dL   Triglycerides 92.0 0.0 - 149.0 mg/dL   HDL 81.30 >39.00 mg/dL   VLDL 18.4 0.0 - 40.0 mg/dL   LDL Cholesterol 121 (H) 0 - 99 mg/dL   Total CHOL/HDL Ratio 3    NonHDL 139.51   TSH  Result Value Ref Range   TSH 2.60 0.35 - 5.50 uIU/mL     Patient Active Problem List   Diagnosis Date Noted   Colon cancer screening 11/14/2022   Hyperlipidemia 11/14/2022   Shortness of breath 10/19/2022   Sciatica 05/16/2022   Bunion of great toe of right foot 05/16/2022   Routine general medical examination at a health care facility 07/08/2020   Screening mammogram, encounter for 09/15/2018   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   right lower lobe pulmonary mass measures 1.8 x 2.2 cm 06/21/2015   Fibroid    Stress reaction 03/04/2012   Lipoma 03/04/2012   APHTHOUS ULCERS 06/09/2010   HEMOCCULT POSITIVE STOOL 10/22/2009   Past Medical History:  Diagnosis Date   Benign metastasizing leiomyoma of uterus 07/2015   Heart murmur    faint   [facility] spotted fever)    Past Surgical History:  Procedure Laterality Date   CESAREAN SECTION     LUNG SURGERY  07/2015   TONSILLECTOMY     VAGINAL HYSTERECTOMY  2007   Posterior colporrhaphy for leiomyoma and rectocele   Social History   Tobacco Use   Smoking status: Former    Types: Cigarettes    Quit date: 02/27/2006    Years since quitting: 16.7   Smokeless tobacco: Never  Vaping Use   Vaping Use: Never used  Substance Use Topics   Alcohol use: Not Currently    Comment: occasional wine   Drug use: No   Family History  Problem Relation Age of Onset   Heart disease Mother 50       bypass and stents    Hypertension Mother    Heart disease Maternal Aunt    Diabetes Maternal  Aunt    Heart disease Maternal  Uncle    Diabetes Maternal Uncle    Heart disease Maternal Grandmother        pacemaker, stents   Diabetes Maternal Grandmother    Heart disease Maternal Grandfather 40   Diabetes Cousin    Allergies  Allergen Reactions   Codeine     REACTION: could not stay alert   Current Outpatient Medications on File Prior to Visit  Medication Sig Dispense Refill   cetirizine (ZYRTEC) 10 MG tablet Take 10 mg by mouth daily.     fluticasone (FLONASE) 50 MCG/ACT nasal spray Place 2 sprays into both nostrils daily. (Patient taking differently: Place 2 sprays into both nostrils daily as needed.) 16 g 0   No current facility-administered medications on file prior to visit.    Review of Systems  Constitutional:  Negative for activity change, appetite change, fatigue, fever and unexpected weight change.  HENT:  Negative for congestion, ear pain, rhinorrhea, sinus pressure and sore throat.   Eyes:  Negative for pain, redness and visual disturbance.  Respiratory:  Negative for cough, shortness of breath and wheezing.   Cardiovascular:  Negative for chest pain and palpitations.  Gastrointestinal:  Negative for abdominal pain, blood in stool, constipation and diarrhea.  Endocrine: Negative for polydipsia and polyuria.  Genitourinary:  Negative for dysuria, frequency and urgency.  Musculoskeletal:  Negative for arthralgias, back pain and myalgias.  Skin:  Negative for pallor and rash.  Allergic/Immunologic: Negative for environmental allergies.  Neurological:  Negative for dizziness, syncope and headaches.  Hematological:  Negative for adenopathy. Does not bruise/bleed easily.  Psychiatric/Behavioral:  Negative for decreased concentration and dysphoric mood. The patient is not nervous/anxious.        Objective:   Physical Exam Constitutional:      General: She is not in acute distress.    Appearance: Normal appearance. She is well-developed and normal weight. She is not ill-appearing or diaphoretic.   HENT:     Head: Normocephalic and atraumatic.     Right Ear: Tympanic membrane, ear canal and external ear normal.     Left Ear: Tympanic membrane, ear canal and external ear normal.     Nose: Nose normal. No congestion.     Mouth/Throat:     Mouth: Mucous membranes are moist.     Pharynx: Oropharynx is clear. No posterior oropharyngeal erythema.  Eyes:     General: No scleral icterus.    Extraocular Movements: Extraocular movements intact.     Conjunctiva/sclera: Conjunctivae normal.     Pupils: Pupils are equal, round, and reactive to light.  Neck:     Thyroid: No thyromegaly.     Vascular: No carotid bruit or JVD.  Cardiovascular:     Rate and Rhythm: Normal rate and regular rhythm.     Pulses: Normal pulses.     Heart sounds: Normal heart sounds.     No gallop.  Pulmonary:     Effort: Pulmonary effort is normal. No respiratory distress.     Breath sounds: Normal breath sounds. No wheezing.     Comments: Good air exch Chest:     Chest wall: No tenderness.  Abdominal:     General: Bowel sounds are normal. There is no distension or abdominal bruit.     Palpations: Abdomen is soft. There is no mass.     Tenderness: There is no abdominal tenderness.     Hernia: No hernia is present.  Genitourinary:    Comments: Breast  and pelvic exams are done by gyn provider  Musculoskeletal:        General: No tenderness. Normal range of motion.     Cervical back: Normal range of motion and neck supple. No rigidity. No muscular tenderness.     Right lower leg: No edema.     Left lower leg: No edema.     Comments: No kyphosis   Lymphadenopathy:     Cervical: No cervical adenopathy.  Skin:    General: Skin is warm and dry.     Coloration: Skin is not pale.     Findings: No erythema or rash.     Comments: Solar lentigines diffusely   Neurological:     Mental Status: She is alert. Mental status is at baseline.     Cranial Nerves: No cranial nerve deficit.     Motor: No abnormal  muscle tone.     Coordination: Coordination normal.     Gait: Gait normal.     Deep Tendon Reflexes: Reflexes are normal and symmetric. Reflexes normal.  Psychiatric:        Mood and Affect: Mood normal.        Cognition and Memory: Cognition and memory normal.           Assessment & Plan:   Problem List Items Addressed This Visit       Other   Colon cancer screening    Interested in colonoscopy  Referral done Pt will call [facility] for appt      Relevant Orders   Ambulatory referral to Gastroenterology   Hyperlipidemia    Mild hyperlipidemia in setting of good diet  Disc goals for lipids and reasons to control them Rev last labs with pt Rev low sat fat diet in detail  LDL is 121  HDL in 31s   Has fam h/o early CAD in mother  Offered to order coronary calcium screen- she wants to consider this and call for referral later        Routine general medical examination at a health care facility - Primary    Reviewed health habits including diet and exercise and skin cancer prevention Reviewed appropriate screening tests for age  Also reviewed health mt list, fam hx and immunization status , as well as social and family history   See HPI Labs reviewed Plans to check on shingrix cost at pharmacy  Colonoscopy referral done for [facility], pt will call for appt Mammogram due in feb/pt aware [facility] gyn care 08/2022

## 2022-11-14 NOTE — Assessment & Plan Note (Signed)
Interested in colonoscopy  Referral done Pt will call [facility] for appt

## 2022-11-14 NOTE — Assessment & Plan Note (Signed)
Mild hyperlipidemia in setting of good diet  Disc goals for lipids and reasons to control them Rev last labs with pt Rev low sat fat diet in detail  LDL is 121  HDL in 71s   Has fam h/o early CAD in mother  Offered to order coronary calcium screen- she wants to consider this and call for referral later

## 2022-11-15 NOTE — Telephone Encounter (Signed)
Per DPR left VM letting pt know Dr. Tower's comments  

## 2022-12-20 DIAGNOSIS — M25552 Pain in left hip: Secondary | ICD-10-CM | POA: Diagnosis not present

## 2022-12-20 DIAGNOSIS — M9902 Segmental and somatic dysfunction of thoracic region: Secondary | ICD-10-CM | POA: Diagnosis not present

## 2022-12-20 DIAGNOSIS — M9903 Segmental and somatic dysfunction of lumbar region: Secondary | ICD-10-CM | POA: Diagnosis not present

## 2022-12-20 DIAGNOSIS — M5451 Vertebrogenic low back pain: Secondary | ICD-10-CM | POA: Diagnosis not present

## 2023-04-02 DIAGNOSIS — H52223 Regular astigmatism, bilateral: Secondary | ICD-10-CM | POA: Diagnosis not present

## 2023-04-02 DIAGNOSIS — H348312 Tributary (branch) retinal vein occlusion, right eye, stable: Secondary | ICD-10-CM | POA: Diagnosis not present

## 2023-04-02 DIAGNOSIS — H524 Presbyopia: Secondary | ICD-10-CM | POA: Diagnosis not present

## 2023-04-02 DIAGNOSIS — H5213 Myopia, bilateral: Secondary | ICD-10-CM | POA: Diagnosis not present

## 2023-05-15 ENCOUNTER — Encounter: Payer: Self-pay | Admitting: Family Medicine

## 2023-05-15 DIAGNOSIS — E7800 Pure hypercholesterolemia, unspecified: Secondary | ICD-10-CM

## 2023-05-29 ENCOUNTER — Ambulatory Visit
Admission: RE | Admit: 2023-05-29 | Discharge: 2023-05-29 | Disposition: A | Discharge status: Home / Self Care | Payer: BC Managed Care – PPO | Source: Ambulatory Visit | Attending: Family Medicine | Admitting: Family Medicine

## 2023-05-29 ENCOUNTER — Ambulatory Visit: Payer: BC Managed Care – PPO

## 2023-05-29 DIAGNOSIS — E7800 Pure hypercholesterolemia, unspecified: Secondary | ICD-10-CM | POA: Insufficient documentation

## 2023-05-30 ENCOUNTER — Ambulatory Visit: Payer: BC Managed Care – PPO

## 2023-06-03 DIAGNOSIS — H0279 Other degenerative disorders of eyelid and periocular area: Secondary | ICD-10-CM | POA: Diagnosis not present

## 2023-06-03 DIAGNOSIS — H02423 Myogenic ptosis of bilateral eyelids: Secondary | ICD-10-CM | POA: Diagnosis not present

## 2023-06-03 DIAGNOSIS — H02413 Mechanical ptosis of bilateral eyelids: Secondary | ICD-10-CM | POA: Diagnosis not present

## 2023-06-03 DIAGNOSIS — H02831 Dermatochalasis of right upper eyelid: Secondary | ICD-10-CM | POA: Diagnosis not present

## 2023-06-03 DIAGNOSIS — H53483 Generalized contraction of visual field, bilateral: Secondary | ICD-10-CM | POA: Diagnosis not present

## 2023-06-03 DIAGNOSIS — Z01818 Encounter for other preprocedural examination: Secondary | ICD-10-CM | POA: Diagnosis not present

## 2023-06-07 ENCOUNTER — Telehealth: Payer: Self-pay | Admitting: Family Medicine

## 2023-06-07 DIAGNOSIS — R931 Abnormal findings on diagnostic imaging of heart and coronary circulation: Secondary | ICD-10-CM

## 2023-06-07 DIAGNOSIS — E7800 Pure hypercholesterolemia, unspecified: Secondary | ICD-10-CM

## 2023-06-07 NOTE — Telephone Encounter (Signed)
-----   Message from Lonia Blood, [place] sent at 06/07/2023 12:17 PM EDT ----- Called patient and reviewed all information. Patient verbalized understanding.  Patient stated unless PCP feels it is an urgent need to start on a statin she would like to wait and meet with the cardiologist first to discuss options. She would like referral placed to [facility] Cardiologist.  Will call if any further questions.

## 2023-06-07 NOTE — Telephone Encounter (Signed)
I put the referral in  Please let us know if you don't hear in 1-2 weeks   Make take a while for [facility]

## 2023-06-13 ENCOUNTER — Encounter: Payer: Self-pay | Admitting: *Deleted

## 2023-08-23 ENCOUNTER — Other Ambulatory Visit: Payer: Self-pay | Admitting: Family Medicine

## 2023-08-23 DIAGNOSIS — Z1231 Encounter for screening mammogram for malignant neoplasm of breast: Secondary | ICD-10-CM

## 2023-08-28 ENCOUNTER — Encounter: Payer: Self-pay | Admitting: Family Medicine

## 2023-08-29 MED ORDER — AMOXICILLIN 500 MG PO CAPS: ORAL_CAPSULE | ORAL | 0 refills | Status: AC

## 2023-08-29 MED ORDER — NYSTATIN 100000 UNIT/ML MT SUSP: OROMUCOSAL | 0 refills | Status: DC

## 2023-08-30 MED ORDER — NYSTATIN 100000 UNIT/ML MT SUSP: OROMUCOSAL | 0 refills | Status: AC

## 2023-08-30 NOTE — Addendum Note (Signed)
Addended by: Benedict Needy on: 08/30/2023 01:47 PM   Modules accepted: Orders

## 2023-08-30 NOTE — Telephone Encounter (Signed)
Patient contacted the office regarding refill request for magic mouthwash (nystatin, lidocaine, diphenhydrAMINE, alum & mag hydroxide) suspension. States that [facility] unable to do suspensions and advised PT to find another pharmacy to fill the medication. PT called around and found that [facility] in [place] would be able to fill this and asked if Dr. Milinda Antis could send her RX there

## 2023-08-30 NOTE — Telephone Encounter (Signed)
Prescription sent to [facility] due to [facility] cannot fill medication.  Pt notified via mychart.

## 2023-09-07 DIAGNOSIS — R69 Illness, unspecified: Secondary | ICD-10-CM | POA: Diagnosis not present

## 2023-09-12 DIAGNOSIS — R69 Illness, unspecified: Secondary | ICD-10-CM | POA: Diagnosis not present

## 2023-09-19 ENCOUNTER — Ambulatory Visit
Admission: RE | Admit: 2023-09-19 | Discharge: 2023-09-19 | Disposition: A | Discharge status: Home / Self Care | Payer: Medicare HMO | Source: Ambulatory Visit | Attending: Family Medicine | Admitting: Family Medicine

## 2023-09-19 DIAGNOSIS — Z1231 Encounter for screening mammogram for malignant neoplasm of breast: Secondary | ICD-10-CM | POA: Diagnosis not present

## 2023-09-23 DIAGNOSIS — H02423 Myogenic ptosis of bilateral eyelids: Secondary | ICD-10-CM | POA: Diagnosis not present

## 2023-09-23 DIAGNOSIS — Z01818 Encounter for other preprocedural examination: Secondary | ICD-10-CM | POA: Diagnosis not present

## 2023-09-23 DIAGNOSIS — H53483 Generalized contraction of visual field, bilateral: Secondary | ICD-10-CM | POA: Diagnosis not present

## 2023-09-23 DIAGNOSIS — H0279 Other degenerative disorders of eyelid and periocular area: Secondary | ICD-10-CM | POA: Diagnosis not present

## 2023-09-23 DIAGNOSIS — H02422 Myogenic ptosis of left eyelid: Secondary | ICD-10-CM | POA: Diagnosis not present

## 2023-09-23 DIAGNOSIS — H02421 Myogenic ptosis of right eyelid: Secondary | ICD-10-CM | POA: Diagnosis not present

## 2023-09-23 DIAGNOSIS — H02831 Dermatochalasis of right upper eyelid: Secondary | ICD-10-CM | POA: Diagnosis not present

## 2023-09-23 DIAGNOSIS — H02413 Mechanical ptosis of bilateral eyelids: Secondary | ICD-10-CM | POA: Diagnosis not present

## 2023-09-23 DIAGNOSIS — H02834 Dermatochalasis of left upper eyelid: Secondary | ICD-10-CM | POA: Diagnosis not present

## 2023-09-23 DIAGNOSIS — H02411 Mechanical ptosis of right eyelid: Secondary | ICD-10-CM | POA: Diagnosis not present

## 2023-09-23 DIAGNOSIS — H57813 Brow ptosis, bilateral: Secondary | ICD-10-CM | POA: Diagnosis not present

## 2023-09-23 DIAGNOSIS — H02412 Mechanical ptosis of left eyelid: Secondary | ICD-10-CM | POA: Diagnosis not present

## 2023-09-30 DIAGNOSIS — R918 Other nonspecific abnormal finding of lung field: Secondary | ICD-10-CM | POA: Diagnosis not present

## 2023-09-30 DIAGNOSIS — R911 Solitary pulmonary nodule: Secondary | ICD-10-CM | POA: Diagnosis not present

## 2023-09-30 DIAGNOSIS — Z87891 Personal history of nicotine dependence: Secondary | ICD-10-CM | POA: Diagnosis not present

## 2023-09-30 DIAGNOSIS — D259 Leiomyoma of uterus, unspecified: Secondary | ICD-10-CM | POA: Diagnosis not present

## 2023-10-03 DIAGNOSIS — R931 Abnormal findings on diagnostic imaging of heart and coronary circulation: Secondary | ICD-10-CM | POA: Diagnosis not present

## 2023-10-05 DIAGNOSIS — R69 Illness, unspecified: Secondary | ICD-10-CM | POA: Diagnosis not present

## 2023-11-19 DIAGNOSIS — M5451 Vertebrogenic low back pain: Secondary | ICD-10-CM | POA: Diagnosis not present

## 2023-11-19 DIAGNOSIS — M9902 Segmental and somatic dysfunction of thoracic region: Secondary | ICD-10-CM | POA: Diagnosis not present

## 2023-11-19 DIAGNOSIS — M25552 Pain in left hip: Secondary | ICD-10-CM | POA: Diagnosis not present

## 2023-11-19 DIAGNOSIS — M9903 Segmental and somatic dysfunction of lumbar region: Secondary | ICD-10-CM | POA: Diagnosis not present

## 2023-11-22 DIAGNOSIS — M9903 Segmental and somatic dysfunction of lumbar region: Secondary | ICD-10-CM | POA: Diagnosis not present

## 2023-11-22 DIAGNOSIS — M25552 Pain in left hip: Secondary | ICD-10-CM | POA: Diagnosis not present

## 2023-11-22 DIAGNOSIS — M9902 Segmental and somatic dysfunction of thoracic region: Secondary | ICD-10-CM | POA: Diagnosis not present

## 2023-11-22 DIAGNOSIS — M5451 Vertebrogenic low back pain: Secondary | ICD-10-CM | POA: Diagnosis not present

## 2023-12-03 DIAGNOSIS — R69 Illness, unspecified: Secondary | ICD-10-CM | POA: Diagnosis not present

## 2023-12-17 ENCOUNTER — Encounter: Payer: BC Managed Care – PPO | Admitting: Family Medicine

## 2023-12-25 ENCOUNTER — Ambulatory Visit: Payer: Medicare HMO | Admitting: Family Medicine

## 2024-01-01 ENCOUNTER — Encounter: Payer: Self-pay | Admitting: Family Medicine

## 2024-01-01 ENCOUNTER — Ambulatory Visit: Payer: Medicare HMO | Admitting: Family Medicine

## 2024-01-01 VITALS — BP 110/68 | HR 73 | Temp 98.0°F | Ht 64.5 in | Wt 137.5 lb

## 2024-01-01 DIAGNOSIS — Z0000 Encounter for general adult medical examination without abnormal findings: Secondary | ICD-10-CM | POA: Diagnosis not present

## 2024-01-01 DIAGNOSIS — K120 Recurrent oral aphthae: Secondary | ICD-10-CM | POA: Diagnosis not present

## 2024-01-01 DIAGNOSIS — D259 Leiomyoma of uterus, unspecified: Secondary | ICD-10-CM | POA: Diagnosis not present

## 2024-01-01 DIAGNOSIS — E7800 Pure hypercholesterolemia, unspecified: Secondary | ICD-10-CM | POA: Diagnosis not present

## 2024-01-01 DIAGNOSIS — R931 Abnormal findings on diagnostic imaging of heart and coronary circulation: Secondary | ICD-10-CM | POA: Diagnosis not present

## 2024-01-01 DIAGNOSIS — Z23 Encounter for immunization: Secondary | ICD-10-CM | POA: Diagnosis not present

## 2024-01-01 DIAGNOSIS — Z1211 Encounter for screening for malignant neoplasm of colon: Secondary | ICD-10-CM

## 2024-01-01 DIAGNOSIS — R5382 Chronic fatigue, unspecified: Secondary | ICD-10-CM

## 2024-01-01 DIAGNOSIS — R5383 Other fatigue: Secondary | ICD-10-CM | POA: Insufficient documentation

## 2024-01-01 LAB — COMPREHENSIVE METABOLIC PANEL
ALT: 17 U/L (ref 0–35)
AST: 22 U/L (ref 0–37)
Albumin: 4.5 g/dL (ref 3.5–5.2)
Alkaline Phosphatase: 70 U/L (ref 39–117)
BUN: 22 mg/dL (ref 6–23)
CO2: 32 meq/L (ref 19–32)
Calcium: 9.5 mg/dL (ref 8.4–10.5)
Chloride: 102 meq/L (ref 96–112)
Creatinine, Ser: 0.75 mg/dL (ref 0.40–1.20)
GFR: 83.61 mL/min (ref >60.00)
Glucose, Bld: 84 mg/dL (ref 70–99)
Potassium: 4.5 meq/L (ref 3.5–5.1)
Sodium: 139 meq/L (ref 135–145)
Total Bilirubin: 1 mg/dL (ref 0.2–1.2)
Total Protein: 6.8 g/dL (ref 6.0–8.3)

## 2024-01-01 LAB — CBC WITH DIFFERENTIAL/PLATELET
Basophils Absolute: 0 10*3/uL (ref 0.0–0.1)
Basophils Relative: 0.9 % (ref 0.0–3.0)
Eosinophils Absolute: 0.1 10*3/uL (ref 0.0–0.7)
Eosinophils Relative: 2 % (ref 0.0–5.0)
HCT: 40.1 % (ref 36.0–46.0)
Hemoglobin: 13.3 g/dL (ref 12.0–15.0)
Lymphocytes Relative: 40.7 % (ref 12.0–46.0)
Lymphs Abs: 1.4 10*3/uL (ref 0.7–4.0)
MCHC: 33 g/dL (ref 30.0–36.0)
MCV: 94 fL (ref 78.0–100.0)
Monocytes Absolute: 0.3 10*3/uL (ref 0.1–1.0)
Monocytes Relative: 8.3 % (ref 3.0–12.0)
Neutro Abs: 1.7 10*3/uL (ref 1.4–7.7)
Neutrophils Relative %: 48.1 % (ref 43.0–77.0)
Platelets: 277 10*3/uL (ref 150.0–400.0)
RBC: 4.27 Mil/uL (ref 3.87–5.11)
RDW: 12.9 % (ref 11.5–15.5)
WBC: 3.5 10*3/uL — ABNORMAL LOW (ref 4.0–10.5)

## 2024-01-01 LAB — LIPID PANEL
Cholesterol: 225 mg/dL — ABNORMAL HIGH (ref 0–200)
HDL: 80.3 mg/dL (ref >39.00)
LDL Cholesterol: 132 mg/dL — ABNORMAL HIGH (ref 0–99)
NonHDL: 144.87
Total CHOL/HDL Ratio: 3
Triglycerides: 65 mg/dL (ref 0.0–149.0)
VLDL: 13 mg/dL (ref 0.0–40.0)

## 2024-01-01 LAB — TSH: TSH: 2.11 u[IU]/mL (ref 0.35–5.50)

## 2024-01-01 NOTE — Assessment & Plan Note (Signed)
In 61%ile Has seen cardiology  Mother had early CAD  Pt is very hesitant to take cholesterol med  Recent Lpa was high at 241.1  Pt wants to repeat this before deciding to take cholesterol med

## 2024-01-01 NOTE — Assessment & Plan Note (Signed)
Uses amox prn - unsure why this works but has been only help so far

## 2024-01-01 NOTE — Progress Notes (Signed)
Subjective:    Patient ID: Brooke Morrow, female    DOB: 10/27/1956, 68 y.o.   MRN: [id number]  HPI  Wt Readings from Last 3 Encounters:  01/01/24 137 lb 8 oz (62.4 kg)  11/14/22 132 lb 2 oz (59.9 kg)  10/19/22 135 lb 6 oz (61.4 kg)   23.24 kg/m  Vitals:   01/01/24 0836  BP: 110/68  Pulse: 73  Temp: 98 F (36.7 C)  SpO2: 95%    Pt presents for welcome to medicare visit    I have personally reviewed the Medicare Annual Wellness questionnaire and have noted 1. The patient's medical and social history 2. Their use of alcohol, tobacco or illicit drugs 3. Their current medications and supplements 4. The patient's functional ability including ADL's, fall risks, home safety risks and hearing or visual             impairment. 5. Diet and physical activities 6. Evidence for depression or mood disorders  The patients weight, height, BMI have been recorded in the chart and visual acuity is per eye clinic.  I have made referrals, counseling and provided education to the patient based review of the above and I have provided the pt with a written personalized care plan for preventive services. Reviewed and updated provider list, see scanned forms.  See scanned forms.  Routine anticipatory guidance given to patient.  See health maintenance. 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  Given info/packet to work on this   No issues with memory or cognition  Is a reader  Does socialize   In general stays fatigued    PMH and SH reviewed  Meds, vitals, and allergies reviewed.   ROS: See HPI.  Otherwise negative.    Weight : Wt Readings from Last 3 Encounters:  01/01/24 137 lb 8 oz (62.4 kg)  11/14/22 132 lb 2 oz (59.9 kg)  10/19/22 135 lb 6 oz (61.4 kg)   23.24 kg/m   Hearing/vision: Hearing Screening   500Hz  1000Hz  2000Hz  4000Hz   Right ear 40 40 40 40  Left ear 40 40 40 40   Vision Screening   Right eye Left eye Both eyes  Without correction 20/20 20/20 20/20   With correction        PHQ:    01/01/2024    9:14 AM 11/14/2022    8:57 AM 09/15/2018    8:34 AM 03/03/2013    3:54 PM 05/15/2011   11:49 AM  Depression screen PHQ 2/9  Decreased Interest 0 0 0 0 0  Down, Depressed, Hopeless 0 0 0 0 0  PHQ - 2 Score 0 0 0 0 0  Altered sleeping 0      Tired, decreased energy 0  Change in appetite 0      Feeling bad or failure about yourself  0      Trouble concentrating 0      Moving slowly or fidgety/restless 0      Suicidal thoughts 0      PHQ-9 Score 0      Difficult doing work/chores Not difficult at all         ADLs: no problems   Functionality: excellent   Care team  Keyaira Clapham=pcp [facility]- chiropractor  Tong=oncology [facility]- cardiology  [facility]- gyn  [facility]= podiatry  [place]= sport med    History of elevated coronary ca score   61%ile  Mother had CAD at early age  Quit smoking in 100s    Hyperlipidemia Lab Results  Component Value Date   CHOL 225 (H) 01/01/2024   HDL 80.30 01/01/2024   LDLCALC 132 (H) 01/01/2024   TRIG 65.0 01/01/2024   CHOLHDL 3 01/01/2024   Lpa done by [facility] cardiology in setting of age advanced coronary calcification  It was elevated at 241.1  She wants to repeat her Lps before starting a  statin medicine   Eats healthy  Some M and Ms occasionally  Has alpha gal - keeps her diet better   [place] food-maybe once per year   Did reduce sodium in diet      Patient Active Problem List   Diagnosis Date Noted   Welcome to Medicare preventive visit 01/01/2024   Fatigue 01/01/2024   Elevated coronary artery calcium score 06/07/2023   Colon cancer screening 11/14/2022   Hyperlipidemia 11/14/2022   Shortness of breath 10/19/2022   Sciatica 05/16/2022   Bunion of great toe of right foot 05/16/2022   Routine general medical examination at a health care facility 07/08/2020   Screening mammogram, encounter for 09/15/2018   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   right lower lobe pulmonary mass measures 1.8 x 2.2 cm 06/21/2015   Stress reaction 03/04/2012   Lipoma 03/04/2012   APHTHOUS ULCERS 06/09/2010   Past Medical History:  Diagnosis Date   Benign metastasizing leiomyoma of uterus 07/2015   Heart murmur    faint   [facility] spotted fever)    Past Surgical History:  Procedure Laterality Date   CESAREAN SECTION     LUNG SURGERY  07/2015   TONSILLECTOMY     VAGINAL HYSTERECTOMY  2007   Posterior colporrhaphy for leiomyoma and rectocele   Social History   Tobacco Use   Smoking status: Former    Current packs/day: 0.00    Types: Cigarettes    Quit date: 02/27/2006    Years since quitting: 17.8   Smokeless tobacco: Never  Vaping Use   Vaping status: Never Used  Substance Use Topics   Alcohol use: Not Currently    Comment: occasional wine   Drug use: No   Family History  Problem Relation Age of Onset   Heart disease Mother 50       bypass and stents    Hypertension Mother    Heart disease Maternal Aunt    Diabetes Maternal Aunt    Heart disease Maternal Uncle    Diabetes Maternal Uncle    Heart disease Maternal Grandmother        pacemaker, stents   Diabetes Maternal Grandmother    Heart disease Maternal Grandfather 40    Diabetes Cousin    Allergies  Allergen Reactions   Codeine     REACTION: could not stay alert  Current Outpatient Medications on File Prior to Visit  Medication Sig Dispense Refill   amoxicillin (AMOXIL) 500 MG capsule TAKE 1 CAPSULE BY MOUTH TWICE A DAY AS NEEDED FOR MOUTH ULCERS 20 capsule 0   cetirizine (ZYRTEC) 10 MG tablet Take 10 mg by mouth daily.     fluticasone (FLONASE) 50 MCG/ACT nasal spray Place 2 sprays into both nostrils daily. (Patient taking differently: Place 2 sprays into both nostrils daily as needed.) 16 g 0   magic mouthwash (nystatin, lidocaine, diphenhydrAMINE, alum & mag hydroxide) suspension Summary: Take 15 mLs by mouth 3 (three) times daily as needed (swish and spit for mouth ulcers). 120 mL 0   No current facility-administered medications on file prior to visit.    Review of Systems  Constitutional:  Positive for fatigue. Negative for activity change, appetite change, fever and unexpected weight change.  HENT:  Negative for congestion, ear pain, rhinorrhea, sinus pressure and sore throat.   Eyes:  Negative for pain, redness and visual disturbance.  Respiratory:  Negative for cough, shortness of breath and wheezing.   Cardiovascular:  Negative for chest pain and palpitations.  Gastrointestinal:  Negative for abdominal pain, blood in stool, constipation and diarrhea.  Endocrine: Negative for polydipsia and polyuria.  Genitourinary:  Negative for dysuria, frequency and urgency.  Musculoskeletal:  Positive for arthralgias. Negative for back pain and myalgias.  Skin:  Negative for pallor and rash.  Allergic/Immunologic: Negative for environmental allergies.  Neurological:  Negative for dizziness, syncope and headaches.  Hematological:  Negative for adenopathy. Does not bruise/bleed easily.  Psychiatric/Behavioral:  Negative for decreased concentration and dysphoric mood. The patient is not nervous/anxious.        Objective:   Physical Exam Constitutional:       General: She is not in acute distress.    Appearance: Normal appearance. She is well-developed. She is not ill-appearing or diaphoretic.  HENT:     Head: Normocephalic and atraumatic.     Right Ear: Tympanic membrane, ear canal and external ear normal.     Left Ear: Tympanic membrane, ear canal and external ear normal.     Nose: Nose normal. No congestion.     Mouth/Throat:     Mouth: Mucous membranes are moist.     Pharynx: Oropharynx is clear. No posterior oropharyngeal erythema.  Eyes:     General: No scleral icterus.    Extraocular Movements: Extraocular movements intact.     Conjunctiva/sclera: Conjunctivae normal.     Pupils: Pupils are equal, round, and reactive to light.  Neck:     Thyroid: No thyromegaly.     Vascular: No carotid bruit or JVD.  Cardiovascular:     Rate and Rhythm: Normal rate and regular rhythm.     Pulses: Normal pulses.     Heart sounds: Normal heart sounds.     No gallop.  Pulmonary:     Effort: Pulmonary effort is normal. No respiratory distress.     Breath sounds: Normal breath sounds. No wheezing.     Comments: Good air exch Chest:     Chest wall: No tenderness.  Abdominal:     General: Bowel sounds are normal. There is no distension or abdominal bruit.     Palpations: Abdomen is soft. There is no mass.     Tenderness: There is no abdominal tenderness.     Hernia: No hernia is present.  Genitourinary:    Comments: Breast and pelvic exam are done by gyn provider   Musculoskeletal:  General: No tenderness. Normal range of motion.     Cervical back: Normal range of motion and neck supple. No rigidity. No muscular tenderness.     Right lower leg: No edema.     Left lower leg: No edema.     Comments: No kyphosis   Lymphadenopathy:     Cervical: No cervical adenopathy.  Skin:    General: Skin is warm and dry.     Coloration: Skin is not pale.     Findings: No erythema or rash.  Neurological:     Mental Status: She is alert.  Mental status is at baseline.     Cranial Nerves: No cranial nerve deficit.     Motor: No abnormal muscle tone.     Coordination: Coordination normal.     Gait: Gait normal.     Deep Tendon Reflexes: Reflexes are normal and symmetric.  Psychiatric:        Mood and Affect: Mood normal.        Cognition and Memory: Cognition and memory normal.           Assessment & Plan:   Problem List Items Addressed This Visit       Cardiovascular and Mediastinum   Elevated coronary artery calcium score   In 61%ile Has seen cardiology  Mother had early CAD  Pt is very hesitant to take cholesterol med  Recent Lpa was high at 241.1  Pt wants to repeat this before deciding to take cholesterol med        Relevant Orders   Lipid panel (Completed)   Lipoprotein A (LPA)     Digestive   APHTHOUS ULCERS   Uses amox prn - unsure why this works but has been only help so far         Genitourinary   Benign metastasizing leiomyoma of uterus   Doing well with gyn f/u  Met to lung in past         Other   Welcome to Medicare preventive visit - Primary   Reviewed health habits including diet and exercise and skin cancer prevention Reviewed appropriate screening tests for age  Also reviewed health mt list, fam hx and immunization status , as well as social and family history   See HPI Labs reviewed and ordered Referral made for colonoscopy  Prevnar 20 vaccine today  Dexa is due/ pt will request at her gyn office  Discussed fall prevention, supplements and exercise for bone density  Health Maintenance  Topic Date Due   Colon Cancer Screening  02/27/2021   Hepatitis C Screening  07/08/2030*   HIV Screening  07/08/2030*   Medicare Annual Wellness Visit  12/31/2024   Pap with HPV screening  08/28/2025   Mammogram  09/18/2025   DTaP/Tdap/Td vaccine (3 - Td or Tdap) 07/08/2030   Pneumonia Vaccine  Completed   Flu Shot  Completed   DEXA scan (bone density measurement)  Completed    COVID-19 Vaccine  Completed   Zoster (Shingles) Vaccine  Completed   HPV Vaccine  Aged Out  *Topic was postponed. The date shown is not the original due date.    Given packet to work on advised directive  No cognitive concerns Hearing and vision screen-normal  PHQ 0 Independent with ADLs with good functionality Reviewed care team       Hyperlipidemia   Recent Lpa elevated at cardiology / [facility] at 241.1  Has elevated coronary ca score in 61%ile   Lipid profile today  Pt asked to have Lpa repeated   I feel that cholesterol medication/statin would reduce her vascular risks  She is very hesitant to treat       Relevant Orders   Comprehensive metabolic panel (Completed)   Lipid panel (Completed)   Lipoprotein A (LPA)   Fatigue   Ongoing  May be multi factoral  No exercise intolerance   Lab today      Relevant Orders   TSH (Completed)   CBC with Differential/Platelet (Completed)   Comprehensive metabolic panel (Completed)   Colon cancer screening   Due for colonoscopy  Last was 2012 Referral done  Pt will call to schedule       Relevant Orders   Ambulatory referral to Gastroenterology   Other Visit Diagnoses       Need for pneumococcal 20-valent conjugate vaccination       Relevant Orders   Pneumococcal conjugate vaccine 20-valent (Prevnar 20) (Completed)

## 2024-01-01 NOTE — Patient Instructions (Addendum)
Prevnar 20 (pneumonia) shot today   Keep up the great exercise   Schedule with your gyn office/ make sure to get a bone density test   Here is packet on living will and POA- fill it out/ get notarized  If you want to send [place] a copy we can put in your chart     I placed a referral for colonoscopy-please call to schedule   You can reach [facility] at [phone].

## 2024-01-01 NOTE — Assessment & Plan Note (Signed)
Ongoing  May be multi factoral  No exercise intolerance   Lab today

## 2024-01-01 NOTE — Assessment & Plan Note (Addendum)
Recent Lpa elevated at cardiology / [facility] at 241.1  Has elevated coronary ca score in 61%ile   Lipid profile today  Pt asked to have Lpa repeated   I feel that cholesterol medication/statin would reduce her vascular risks  She is very hesitant to treat

## 2024-01-01 NOTE — Assessment & Plan Note (Signed)
Due for colonoscopy  Last was 2012 Referral done  Pt will call to schedule

## 2024-01-01 NOTE — Assessment & Plan Note (Signed)
Doing well with gyn f/u  Met to lung in past

## 2024-01-01 NOTE — Assessment & Plan Note (Signed)
Reviewed health habits including diet and exercise and skin cancer prevention Reviewed appropriate screening tests for age  Also reviewed health mt list, fam hx and immunization status , as well as social and family history   See HPI Labs reviewed and ordered Referral made for colonoscopy  Prevnar 20 vaccine today  Dexa is due/ pt will request at her gyn office  Discussed fall prevention, supplements and exercise for bone density  Health Maintenance  Topic Date Due   Colon Cancer Screening  02/27/2021   Hepatitis C Screening  07/08/2030*   HIV Screening  07/08/2030*   Medicare Annual Wellness Visit  12/31/2024   Pap with HPV screening  08/28/2025   Mammogram  09/18/2025   DTaP/Tdap/Td vaccine (3 - Td or Tdap) 07/08/2030   Pneumonia Vaccine  Completed   Flu Shot  Completed   DEXA scan (bone density measurement)  Completed   COVID-19 Vaccine  Completed   Zoster (Shingles) Vaccine  Completed   HPV Vaccine  Aged Out  *Topic was postponed. The date shown is not the original due date.    Given packet to work on advised directive  No cognitive concerns Hearing and vision screen-normal  PHQ 0 Independent with ADLs with good functionality Reviewed care team

## 2024-01-02 ENCOUNTER — Encounter: Payer: Self-pay | Admitting: Family Medicine

## 2024-01-04 LAB — LIPOPROTEIN A (LPA): Lipoprotein (a): 271 nmol/L — ABNORMAL HIGH (ref <75)

## 2024-01-22 ENCOUNTER — Encounter: Payer: Self-pay | Admitting: Family Medicine

## 2024-01-22 ENCOUNTER — Encounter: Payer: Self-pay | Admitting: *Deleted

## 2024-02-24 DIAGNOSIS — Z1211 Encounter for screening for malignant neoplasm of colon: Secondary | ICD-10-CM | POA: Diagnosis not present

## 2024-04-10 DIAGNOSIS — R931 Abnormal findings on diagnostic imaging of heart and coronary circulation: Secondary | ICD-10-CM | POA: Diagnosis not present

## 2024-04-10 DIAGNOSIS — R0789 Other chest pain: Secondary | ICD-10-CM | POA: Diagnosis not present

## 2024-04-10 DIAGNOSIS — E7841 Elevated Lipoprotein(a): Secondary | ICD-10-CM | POA: Diagnosis not present

## 2024-05-08 DIAGNOSIS — R931 Abnormal findings on diagnostic imaging of heart and coronary circulation: Secondary | ICD-10-CM | POA: Diagnosis not present

## 2024-05-08 DIAGNOSIS — R0789 Other chest pain: Secondary | ICD-10-CM | POA: Diagnosis not present

## 2024-05-15 DIAGNOSIS — R9439 Abnormal result of other cardiovascular function study: Secondary | ICD-10-CM | POA: Diagnosis not present

## 2024-05-15 DIAGNOSIS — E7841 Elevated Lipoprotein(a): Secondary | ICD-10-CM | POA: Diagnosis not present

## 2024-05-15 DIAGNOSIS — R931 Abnormal findings on diagnostic imaging of heart and coronary circulation: Secondary | ICD-10-CM | POA: Diagnosis not present

## 2024-05-15 DIAGNOSIS — R0789 Other chest pain: Secondary | ICD-10-CM | POA: Diagnosis not present

## 2024-06-04 DIAGNOSIS — K635 Polyp of colon: Secondary | ICD-10-CM | POA: Diagnosis not present

## 2024-06-04 DIAGNOSIS — Z1211 Encounter for screening for malignant neoplasm of colon: Secondary | ICD-10-CM | POA: Diagnosis not present

## 2024-06-04 LAB — HM COLONOSCOPY

## 2024-06-09 DIAGNOSIS — J984 Other disorders of lung: Secondary | ICD-10-CM | POA: Diagnosis not present

## 2024-06-09 DIAGNOSIS — K635 Polyp of colon: Secondary | ICD-10-CM | POA: Diagnosis not present

## 2024-06-09 DIAGNOSIS — I2510 Atherosclerotic heart disease of native coronary artery without angina pectoris: Secondary | ICD-10-CM | POA: Diagnosis not present

## 2024-06-09 DIAGNOSIS — R931 Abnormal findings on diagnostic imaging of heart and coronary circulation: Secondary | ICD-10-CM | POA: Diagnosis not present

## 2024-06-09 DIAGNOSIS — R9439 Abnormal result of other cardiovascular function study: Secondary | ICD-10-CM | POA: Diagnosis not present

## 2024-06-09 DIAGNOSIS — R0789 Other chest pain: Secondary | ICD-10-CM | POA: Diagnosis not present

## 2024-07-07 DIAGNOSIS — H524 Presbyopia: Secondary | ICD-10-CM | POA: Diagnosis not present

## 2024-07-07 DIAGNOSIS — H353132 Nonexudative age-related macular degeneration, bilateral, intermediate dry stage: Secondary | ICD-10-CM | POA: Diagnosis not present

## 2024-07-07 DIAGNOSIS — H348312 Tributary (branch) retinal vein occlusion, right eye, stable: Secondary | ICD-10-CM | POA: Diagnosis not present

## 2024-07-07 DIAGNOSIS — H52223 Regular astigmatism, bilateral: Secondary | ICD-10-CM | POA: Diagnosis not present

## 2024-07-07 DIAGNOSIS — H2513 Age-related nuclear cataract, bilateral: Secondary | ICD-10-CM | POA: Diagnosis not present

## 2024-07-07 DIAGNOSIS — H5213 Myopia, bilateral: Secondary | ICD-10-CM | POA: Diagnosis not present

## 2024-07-07 DIAGNOSIS — H02831 Dermatochalasis of right upper eyelid: Secondary | ICD-10-CM | POA: Diagnosis not present

## 2024-07-07 DIAGNOSIS — H02834 Dermatochalasis of left upper eyelid: Secondary | ICD-10-CM | POA: Diagnosis not present

## 2024-08-03 ENCOUNTER — Encounter: Payer: Self-pay | Admitting: Family Medicine

## 2024-08-05 DIAGNOSIS — Z09 Encounter for follow-up examination after completed treatment for conditions other than malignant neoplasm: Secondary | ICD-10-CM | POA: Diagnosis not present

## 2024-08-05 DIAGNOSIS — H0279 Other degenerative disorders of eyelid and periocular area: Secondary | ICD-10-CM | POA: Diagnosis not present

## 2024-08-05 DIAGNOSIS — H02421 Myogenic ptosis of right eyelid: Secondary | ICD-10-CM | POA: Diagnosis not present

## 2024-08-05 DIAGNOSIS — H57813 Brow ptosis, bilateral: Secondary | ICD-10-CM | POA: Diagnosis not present

## 2024-09-21 DIAGNOSIS — H53483 Generalized contraction of visual field, bilateral: Secondary | ICD-10-CM | POA: Diagnosis not present

## 2024-10-16 DIAGNOSIS — I2510 Atherosclerotic heart disease of native coronary artery without angina pectoris: Secondary | ICD-10-CM | POA: Diagnosis not present

## 2024-10-16 DIAGNOSIS — E7841 Elevated Lipoprotein(a): Secondary | ICD-10-CM | POA: Diagnosis not present

## 2024-10-16 DIAGNOSIS — E7800 Pure hypercholesterolemia, unspecified: Secondary | ICD-10-CM | POA: Diagnosis not present

## 2025-03-25 ENCOUNTER — Ambulatory Visit

## 2025-03-25 ENCOUNTER — Encounter: Admitting: Family Medicine
